# Patient Record
Sex: Female | Born: 1970 | Race: White | Hispanic: No | Marital: Married | Smoking: Never smoker
Health system: Southern US, Community
[De-identification: ages and names within clinical notes are randomized; demographics above are authoritative.]

## PROBLEM LIST (undated history)

## (undated) DIAGNOSIS — N200 Calculus of kidney: Secondary | ICD-10-CM

## (undated) DIAGNOSIS — Z800 Family history of malignant neoplasm of digestive organs: Secondary | ICD-10-CM

## (undated) DIAGNOSIS — C50919 Malignant neoplasm of unspecified site of unspecified female breast: Secondary | ICD-10-CM

## (undated) HISTORY — DX: Family history of malignant neoplasm of digestive organs: Z80.0

## (undated) HISTORY — DX: Malignant neoplasm of unspecified site of unspecified female breast: C50.919

## (undated) HISTORY — PX: OTHER SURGICAL HISTORY: SHX169

---

## 2009-04-15 ENCOUNTER — Encounter: Admission: RE | Admit: 2009-04-15 | Discharge: 2009-04-15 | Payer: Self-pay | Admitting: Family Medicine

## 2010-05-09 ENCOUNTER — Other Ambulatory Visit: Payer: Self-pay | Admitting: Family Medicine

## 2011-01-16 ENCOUNTER — Other Ambulatory Visit: Payer: Self-pay | Admitting: Family Medicine

## 2011-01-16 DIAGNOSIS — Z1231 Encounter for screening mammogram for malignant neoplasm of breast: Secondary | ICD-10-CM

## 2011-02-09 ENCOUNTER — Ambulatory Visit: Payer: Self-pay

## 2011-03-19 ENCOUNTER — Ambulatory Visit
Admission: RE | Admit: 2011-03-19 | Discharge: 2011-03-19 | Disposition: A | Discharge status: Home / Self Care | Payer: Commercial Indemnity | Source: Ambulatory Visit | Attending: Family Medicine | Admitting: Family Medicine

## 2011-03-19 DIAGNOSIS — Z1231 Encounter for screening mammogram for malignant neoplasm of breast: Secondary | ICD-10-CM

## 2012-03-31 ENCOUNTER — Other Ambulatory Visit: Payer: Self-pay | Admitting: Family Medicine

## 2012-03-31 DIAGNOSIS — Z1231 Encounter for screening mammogram for malignant neoplasm of breast: Secondary | ICD-10-CM

## 2012-04-22 ENCOUNTER — Ambulatory Visit
Admission: RE | Admit: 2012-04-22 | Discharge: 2012-04-22 | Disposition: A | Discharge status: Home / Self Care | Payer: 59 | Source: Ambulatory Visit | Attending: Family Medicine | Admitting: Family Medicine

## 2012-12-11 ENCOUNTER — Encounter (HOSPITAL_COMMUNITY): Payer: Self-pay

## 2012-12-16 ENCOUNTER — Encounter (HOSPITAL_COMMUNITY): Payer: Self-pay | Admitting: Pharmacist

## 2012-12-18 ENCOUNTER — Other Ambulatory Visit: Payer: Self-pay | Admitting: Obstetrics and Gynecology

## 2013-01-01 ENCOUNTER — Ambulatory Visit (HOSPITAL_COMMUNITY): Admission: RE | Admit: 2013-01-01 | Payer: 59 | Source: Ambulatory Visit | Admitting: Obstetrics and Gynecology

## 2013-01-01 ENCOUNTER — Encounter (HOSPITAL_COMMUNITY): Admission: RE | Payer: Self-pay | Source: Ambulatory Visit

## 2013-01-01 SURGERY — DILATATION & CURETTAGE/HYSTEROSCOPY WITH RESECTOCOPE
Anesthesia: Choice

## 2013-04-24 ENCOUNTER — Other Ambulatory Visit: Payer: Self-pay

## 2013-04-24 DIAGNOSIS — Z1231 Encounter for screening mammogram for malignant neoplasm of breast: Secondary | ICD-10-CM

## 2013-05-08 ENCOUNTER — Ambulatory Visit
Admission: RE | Admit: 2013-05-08 | Discharge: 2013-05-08 | Disposition: A | Discharge status: Home / Self Care | Payer: 59 | Source: Ambulatory Visit

## 2013-05-08 DIAGNOSIS — Z1231 Encounter for screening mammogram for malignant neoplasm of breast: Secondary | ICD-10-CM

## 2018-11-02 ENCOUNTER — Encounter (HOSPITAL_BASED_OUTPATIENT_CLINIC_OR_DEPARTMENT_OTHER): Payer: Self-pay | Admitting: Emergency Medicine

## 2018-11-02 ENCOUNTER — Emergency Department (HOSPITAL_BASED_OUTPATIENT_CLINIC_OR_DEPARTMENT_OTHER): Payer: 59

## 2018-11-02 ENCOUNTER — Other Ambulatory Visit: Payer: Self-pay

## 2018-11-02 ENCOUNTER — Emergency Department (HOSPITAL_BASED_OUTPATIENT_CLINIC_OR_DEPARTMENT_OTHER)
Admission: EM | Admit: 2018-11-02 | Discharge: 2018-11-02 | Disposition: A | Discharge status: Home / Self Care | Payer: 59 | Attending: Emergency Medicine | Admitting: Emergency Medicine

## 2018-11-02 DIAGNOSIS — N130 Hydronephrosis with ureteropelvic junction obstruction: Secondary | ICD-10-CM | POA: Diagnosis not present

## 2018-11-02 DIAGNOSIS — R1031 Right lower quadrant pain: Secondary | ICD-10-CM | POA: Diagnosis present

## 2018-11-02 DIAGNOSIS — N200 Calculus of kidney: Secondary | ICD-10-CM

## 2018-11-02 LAB — URINALYSIS, ROUTINE W REFLEX MICROSCOPIC
Bilirubin Urine: NEGATIVE
Glucose, UA: NEGATIVE mg/dL
Ketones, ur: 40 mg/dL — AB
Leukocytes,Ua: NEGATIVE
Nitrite: NEGATIVE
Protein, ur: NEGATIVE mg/dL
Specific Gravity, Urine: >1.03 — ABNORMAL HIGH (ref 1.005–1.030)
pH: 5.5 (ref 5.0–8.0)

## 2018-11-02 LAB — CBC WITH DIFFERENTIAL/PLATELET
Abs Immature Granulocytes: 0.06 10*3/uL (ref 0.00–0.07)
Basophils Absolute: 0 10*3/uL (ref 0.0–0.1)
Basophils Relative: 0 %
Eosinophils Absolute: 0 10*3/uL (ref 0.0–0.5)
Eosinophils Relative: 0 %
HCT: 42.5 % (ref 36.0–46.0)
Hemoglobin: 14.4 g/dL (ref 12.0–15.0)
Immature Granulocytes: 0 %
Lymphocytes Relative: 8 %
Lymphs Abs: 1.2 10*3/uL (ref 0.7–4.0)
MCH: 31.6 pg (ref 26.0–34.0)
MCHC: 33.9 g/dL (ref 30.0–36.0)
MCV: 93.4 fL (ref 80.0–100.0)
Monocytes Absolute: 1 10*3/uL (ref 0.1–1.0)
Monocytes Relative: 6 %
Neutro Abs: 13.6 10*3/uL — ABNORMAL HIGH (ref 1.7–7.7)
Neutrophils Relative %: 86 %
Platelets: 307 10*3/uL (ref 150–400)
RBC: 4.55 MIL/uL (ref 3.87–5.11)
RDW: 12.4 % (ref 11.5–15.5)
WBC: 15.9 10*3/uL — ABNORMAL HIGH (ref 4.0–10.5)
nRBC: 0 % (ref 0.0–0.2)

## 2018-11-02 LAB — BASIC METABOLIC PANEL
Anion gap: 17 — ABNORMAL HIGH (ref 5–15)
BUN: 20 mg/dL (ref 6–20)
CO2: 16 mmol/L — ABNORMAL LOW (ref 22–32)
Calcium: 9.1 mg/dL (ref 8.9–10.3)
Chloride: 106 mmol/L (ref 98–111)
Creatinine, Ser: 0.92 mg/dL (ref 0.44–1.00)
GFR calc Af Amer: >60 mL/min (ref >60)
GFR calc non Af Amer: >60 mL/min (ref >60)
Glucose, Bld: 173 mg/dL — ABNORMAL HIGH (ref 70–99)
Potassium: 3.3 mmol/L — ABNORMAL LOW (ref 3.5–5.1)
Sodium: 139 mmol/L (ref 135–145)

## 2018-11-02 LAB — URINALYSIS, MICROSCOPIC (REFLEX)

## 2018-11-02 MED ORDER — ONDANSETRON HCL 4 MG/2ML IJ SOLN
INTRAMUSCULAR | Status: AC
  Administered 2018-11-02: 4 mg via INTRAVENOUS
  Filled 2018-11-02: qty 2

## 2018-11-02 MED ORDER — OXYCODONE-ACETAMINOPHEN 5-325 MG PO TABS
2.0000 | ORAL_TABLET | Freq: Once | ORAL | Status: AC
  Administered 2018-11-02: 08:00:00 2 via ORAL
  Filled 2018-11-02: qty 2

## 2018-11-02 MED ORDER — KETOROLAC TROMETHAMINE 30 MG/ML IJ SOLN
INTRAMUSCULAR | Status: AC
  Administered 2018-11-02: 30 mg via INTRAVENOUS
  Filled 2018-11-02: qty 1

## 2018-11-02 MED ORDER — OXYCODONE-ACETAMINOPHEN 5-325 MG PO TABS: 1.0000 | ORAL_TABLET | Freq: Four times a day (QID) | ORAL | 0 refills | Status: DC | PRN

## 2018-11-02 MED ORDER — KETOROLAC TROMETHAMINE 30 MG/ML IJ SOLN
30.0000 mg | Freq: Once | INTRAMUSCULAR | Status: AC
  Administered 2018-11-02: 06:00:00 30 mg via INTRAVENOUS

## 2018-11-02 MED ORDER — ONDANSETRON HCL 4 MG/2ML IJ SOLN
INTRAMUSCULAR | Status: AC
  Filled 2018-11-02: qty 2

## 2018-11-02 MED ORDER — MORPHINE SULFATE (PF) 4 MG/ML IV SOLN
4.0000 mg | Freq: Once | INTRAVENOUS | Status: AC
  Administered 2018-11-02: 06:00:00 4 mg via INTRAVENOUS

## 2018-11-02 MED ORDER — ONDANSETRON HCL 4 MG/2ML IJ SOLN
4.0000 mg | Freq: Once | INTRAMUSCULAR | Status: AC
  Administered 2018-11-02: 06:00:00 4 mg via INTRAVENOUS

## 2018-11-02 MED ORDER — MORPHINE SULFATE (PF) 4 MG/ML IV SOLN
INTRAVENOUS | Status: AC
  Administered 2018-11-02: 4 mg via INTRAVENOUS
  Filled 2018-11-02: qty 1

## 2018-11-02 MED ORDER — ONDANSETRON HCL 4 MG/2ML IJ SOLN
4.0000 mg | Freq: Once | INTRAMUSCULAR | Status: AC
  Administered 2018-11-02: 07:00:00 4 mg via INTRAVENOUS

## 2018-11-02 NOTE — ED Triage Notes (Signed)
Patient states that she started to have right sided flank pain at 11 pm

## 2018-11-02 NOTE — ED Provider Notes (Signed)
[facility]ER[facility] DEPARTMENT Provider Note   CSN: [id number] Arrival date & time: 11/02/18  0455     History   Chief Complaint Chief Complaint  Patient presents with  . Flank Pain    HPI Jasmine Shelton is a 48 y.o. female.     Patient is a 48 year old female with no significant past medical history.  She presents today for evaluation of right flank pain.  This woke her from sleep at approximately 1130 this evening and is rapidly worsening.  She denies any bowel or bladder complaints.  She denies any fevers or chills.  She denies prior history of kidney stones.  The history is provided by the patient.  Flank Pain This is a new problem. Episode onset: 11:30 PM. The problem occurs constantly. The problem has been rapidly worsening. Pertinent negatives include no abdominal pain. Nothing aggravates the symptoms. Nothing relieves the symptoms. She has tried nothing for the symptoms.    History reviewed. No pertinent past medical history.  There are no active problems to display for this patient.   Past Surgical History:  Procedure Laterality Date  . cold knife conization       OB History   No obstetric history on file.      Home Medications    Prior to Admission medications   Medication Sig Start Date End Date Taking? Authorizing Provider  ibuprofen (ADVIL,MOTRIN) 200 MG tablet Take 600 mg by mouth every 6 (six) hours as needed for headache.    [provider]    Family History History reviewed. No pertinent family history.  Social History Social History   Tobacco Use  . Smoking status: Never Smoker  . Smokeless tobacco: Never Used  Substance Use Topics  . Alcohol use: No  . Drug use: No     Allergies   Patient has no known allergies.   Review of Systems Review of Systems  Gastrointestinal: Negative for abdominal pain.  Genitourinary: Positive for flank pain.  All other systems reviewed and are negative.    Physical Exam  Updated Vital Signs Temp 98.5 F (36.9 C) (Oral)   Resp 20   Ht 5\' 3"  (1.6 m)   Wt 57.2 kg   LMP 11/24/2012   SpO2 100%   BMI 22.32 kg/m   Physical Exam Vitals signs and nursing note reviewed.  Constitutional:      General: She is not in acute distress.    Appearance: She is well-developed. She is not diaphoretic.  HENT:     Head: Normocephalic and atraumatic.  Neck:     Musculoskeletal: Normal range of motion and neck supple.  Cardiovascular:     Rate and Rhythm: Normal rate and regular rhythm.     Heart sounds: No murmur. No friction rub. No gallop.   Pulmonary:     Effort: Pulmonary effort is normal. No respiratory distress.     Breath sounds: Normal breath sounds. No wheezing.  Abdominal:     General: Bowel sounds are normal. There is no distension.     Palpations: Abdomen is soft.     Tenderness: There is no abdominal tenderness. There is right CVA tenderness. There is no left CVA tenderness, guarding or rebound.  Musculoskeletal: Normal range of motion.  Skin:    General: Skin is warm and dry.  Neurological:     Mental Status: She is alert and oriented to person, place, and time.      ED Treatments / Results  Labs (all labs ordered  are listed, but only abnormal results are displayed) Labs Reviewed  BASIC METABOLIC PANEL  CBC WITH DIFFERENTIAL/PLATELET  URINALYSIS, ROUTINE W REFLEX MICROSCOPIC    EKG None  Radiology No results found.  Procedures Procedures (including critical care time)  Medications Ordered in ED Medications  morphine 4 MG/ML injection 4 mg (4 mg Intravenous Given 11/02/18 0533)  ondansetron (ZOFRAN) injection 4 mg (4 mg Intravenous Given 11/02/18 0533)  ketorolac (TORADOL) 30 MG/ML injection 30 mg (30 mg Intravenous Given 11/02/18 0533)     Initial Impression / Assessment and Plan / ED Course  I have reviewed the triage vital signs and the nursing notes.  Pertinent labs & imaging results that were available during my care of the  patient were reviewed by me and considered in my medical decision making (see chart for details).  Patient presenting here with complaints of right flank pain.  She has what appears to be a 3 mm calculus in the right UVJ or urinary bladder with hydronephrosis.  She is feeling better after pain medications.  Will treat with pain meds, return prn.    Final Clinical Impressions(s) / ED Diagnoses   Final diagnoses:  None    ED Discharge Orders    None       Veryl Speak, MD 11/02/18 [phone]

## 2018-11-02 NOTE — Discharge Instructions (Signed)
Percocet as prescribed as needed for pain.  Follow-up with urology if your symptoms or not improving in the next 3 to 4 days, and return to the ER if you develop worsening pain, high fever, or other new and concerning symptoms.  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  The radiologist has recommended outpatient urology follow-up.  The contact information for [facility] has been provided in this discharge summary for you to call and make these arrangements.

## 2019-01-10 ENCOUNTER — Emergency Department (HOSPITAL_BASED_OUTPATIENT_CLINIC_OR_DEPARTMENT_OTHER)
Admission: EM | Admit: 2019-01-10 | Discharge: 2019-01-10 | Disposition: A | Discharge status: Home / Self Care | Payer: 59 | Attending: Emergency Medicine | Admitting: Emergency Medicine

## 2019-01-10 ENCOUNTER — Encounter (HOSPITAL_BASED_OUTPATIENT_CLINIC_OR_DEPARTMENT_OTHER): Payer: Self-pay | Admitting: Emergency Medicine

## 2019-01-10 ENCOUNTER — Other Ambulatory Visit: Payer: Self-pay

## 2019-01-10 ENCOUNTER — Emergency Department (HOSPITAL_BASED_OUTPATIENT_CLINIC_OR_DEPARTMENT_OTHER): Payer: 59

## 2019-01-10 DIAGNOSIS — R109 Unspecified abdominal pain: Secondary | ICD-10-CM | POA: Diagnosis present

## 2019-01-10 DIAGNOSIS — N201 Calculus of ureter: Secondary | ICD-10-CM | POA: Diagnosis not present

## 2019-01-10 HISTORY — DX: Calculus of kidney: N20.0

## 2019-01-10 LAB — BASIC METABOLIC PANEL
Anion gap: 12 (ref 5–15)
BUN: 18 mg/dL (ref 6–20)
CO2: 19 mmol/L — ABNORMAL LOW (ref 22–32)
Calcium: 9.6 mg/dL (ref 8.9–10.3)
Chloride: 108 mmol/L (ref 98–111)
Creatinine, Ser: 1 mg/dL (ref 0.44–1.00)
GFR calc Af Amer: >60 mL/min (ref >60)
GFR calc non Af Amer: >60 mL/min (ref >60)
Glucose, Bld: 166 mg/dL — ABNORMAL HIGH (ref 70–99)
Potassium: 3.3 mmol/L — ABNORMAL LOW (ref 3.5–5.1)
Sodium: 139 mmol/L (ref 135–145)

## 2019-01-10 LAB — URINALYSIS, MICROSCOPIC (REFLEX)

## 2019-01-10 LAB — URINALYSIS, ROUTINE W REFLEX MICROSCOPIC
Bilirubin Urine: NEGATIVE
Glucose, UA: NEGATIVE mg/dL
Ketones, ur: >80 mg/dL — AB
Leukocytes,Ua: NEGATIVE
Nitrite: NEGATIVE
Protein, ur: NEGATIVE mg/dL
Specific Gravity, Urine: >1.03 — ABNORMAL HIGH (ref 1.005–1.030)
pH: 6 (ref 5.0–8.0)

## 2019-01-10 LAB — CBC
HCT: 42.4 % (ref 36.0–46.0)
Hemoglobin: 14.6 g/dL (ref 12.0–15.0)
MCH: 31.4 pg (ref 26.0–34.0)
MCHC: 34.4 g/dL (ref 30.0–36.0)
MCV: 91.2 fL (ref 80.0–100.0)
Platelets: 335 10*3/uL (ref 150–400)
RBC: 4.65 MIL/uL (ref 3.87–5.11)
RDW: 12.6 % (ref 11.5–15.5)
WBC: 11.1 10*3/uL — ABNORMAL HIGH (ref 4.0–10.5)
nRBC: 0 % (ref 0.0–0.2)

## 2019-01-10 MED ORDER — NAPROXEN 500 MG PO TABS: 500.0000 mg | ORAL_TABLET | Freq: Two times a day (BID) | ORAL | 0 refills | Status: DC

## 2019-01-10 MED ORDER — MORPHINE SULFATE (PF) 2 MG/ML IV SOLN
2.0000 mg | Freq: Once | INTRAVENOUS | Status: AC
  Administered 2019-01-10: 2 mg via INTRAVENOUS
  Filled 2019-01-10: qty 1

## 2019-01-10 MED ORDER — ONDANSETRON HCL 4 MG/2ML IJ SOLN
4.0000 mg | Freq: Once | INTRAMUSCULAR | Status: AC
  Administered 2019-01-10: 4 mg via INTRAVENOUS
  Filled 2019-01-10: qty 2

## 2019-01-10 MED ORDER — SODIUM CHLORIDE 0.9 % IV BOLUS
500.0000 mL | Freq: Once | INTRAVENOUS | Status: AC
  Administered 2019-01-10: 08:00:00 500 mL via INTRAVENOUS

## 2019-01-10 MED ORDER — MORPHINE SULFATE (PF) 2 MG/ML IV SOLN
2.0000 mg | Freq: Once | INTRAVENOUS | Status: AC
  Administered 2019-01-10: 08:00:00 2 mg via INTRAVENOUS
  Filled 2019-01-10: qty 1

## 2019-01-10 MED ORDER — KETOROLAC TROMETHAMINE 30 MG/ML IJ SOLN
30.0000 mg | Freq: Once | INTRAMUSCULAR | Status: AC
  Administered 2019-01-10: 30 mg via INTRAVENOUS
  Filled 2019-01-10: qty 1

## 2019-01-10 MED ORDER — SODIUM CHLORIDE 0.9 % IV SOLN
INTRAVENOUS | Status: DC
  Administered 2019-01-10: 08:00:00 via INTRAVENOUS

## 2019-01-10 MED ORDER — HYDROCODONE-ACETAMINOPHEN 5-325 MG PO TABS: 1.0000 | ORAL_TABLET | Freq: Four times a day (QID) | ORAL | 0 refills | Status: DC | PRN

## 2019-01-10 MED ORDER — ONDANSETRON 4 MG PO TBDP: 4.0000 mg | ORAL_TABLET | Freq: Three times a day (TID) | ORAL | 1 refills | Status: DC | PRN

## 2019-01-10 NOTE — ED Notes (Signed)
Family at bedside. 

## 2019-01-10 NOTE — ED Triage Notes (Signed)
Pt c/o left sided flank pain since 5am. States it feels like the last time she had kidney stones. N/V present.

## 2019-01-10 NOTE — Discharge Instructions (Addendum)
Take the Naprosyn as directed on a regular basis.  Use the Zofran for nausea and vomiting.  For more severe pain take the hydrocodone 1 tablet every 6 hours.  Make an appointment to follow-up with urology.  Return for any new or worse symptoms to include fever persistent vomiting.  Also your urine was sent for urine culture.  If it grows any significant bacteria suggestive of infection you will be contacted.

## 2019-01-10 NOTE — ED Provider Notes (Addendum)
[facility] DEPARTMENT Provider Note   CSN: [id number] Arrival date & time: 01/10/19  [id number]     History   Chief Complaint Chief Complaint  Patient presents with  . Flank Pain  . Nausea  . Emesis    HPI Jasmine Shelton is a 48 y.o. female.     Patient with known history of kidney stones.  Acute onset of left flank pain at 5 AM this morning.  Associated with nausea and vomiting.  Pain reminds her of her last kidney stone attack which was in September.     Past Medical History:  Diagnosis Date  . Kidney stones     There are no active problems to display for this patient.   Past Surgical History:  Procedure Laterality Date  . cold knife conization       OB History   No obstetric history on file.      Home Medications    Prior to Admission medications   Medication Sig Start Date End Date Taking? Authorizing Provider  HYDROcodone-acetaminophen (NORCO/VICODIN) 5-325 MG tablet Take 1 tablet by mouth every 6 (six) hours as needed for moderate pain. 01/10/19   Fredia Sorrow, MD  ibuprofen (ADVIL,MOTRIN) 200 MG tablet Take 600 mg by mouth every 6 (six) hours as needed for headache.    [provider]  naproxen (NAPROSYN) 500 MG tablet Take 1 tablet (500 mg total) by mouth 2 (two) times daily. 01/10/19   Fredia Sorrow, MD  ondansetron (ZOFRAN ODT) 4 MG disintegrating tablet Take 1 tablet (4 mg total) by mouth every 8 (eight) hours as needed. 01/10/19   Fredia Sorrow, MD  oxyCODONE-acetaminophen (PERCOCET) 5-325 MG tablet Take 1-2 tablets by mouth every 6 (six) hours as needed. 11/02/18   Veryl Speak, MD    Family History History reviewed. No pertinent family history.  Social History Social History   Tobacco Use  . Smoking status: Never Smoker  . Smokeless tobacco: Never Used  Substance Use Topics  . Alcohol use: No  . Drug use: No     Allergies   Patient has no known allergies.   Review of Systems Review of  Systems  Constitutional: Negative for chills and fever.  HENT: Negative for congestion, rhinorrhea and sore throat.   Eyes: Negative for visual disturbance.  Respiratory: Negative for cough and shortness of breath.   Cardiovascular: Negative for chest pain and leg swelling.  Gastrointestinal: Positive for nausea and vomiting. Negative for abdominal pain and diarrhea.  Genitourinary: Positive for flank pain. Negative for dysuria.  Musculoskeletal: Negative for back pain and neck pain.  Skin: Negative for rash.  Neurological: Negative for dizziness, light-headedness and headaches.  Hematological: Does not bruise/bleed easily.  Psychiatric/Behavioral: Negative for confusion.     Physical Exam Updated Vital Signs BP (!) 162/93 (BP Location: Right Arm)   Pulse 74   Temp 98.3 F (36.8 C) (Oral)   Resp (!) 24   Ht 1.6 m (5\' 3" )   Wt 58.1 kg   LMP 11/24/2012   SpO2 100%   BMI 22.67 kg/m   Physical Exam Vitals signs and nursing note reviewed.  Constitutional:      General: She is not in acute distress.    Appearance: Normal appearance. She is well-developed.  HENT:     Head: Normocephalic and atraumatic.  Eyes:     Extraocular Movements: Extraocular movements intact.     Conjunctiva/sclera: Conjunctivae normal.     Pupils: Pupils are equal, round, and reactive to  light.  Neck:     Musculoskeletal: Normal range of motion and neck supple.  Cardiovascular:     Rate and Rhythm: Normal rate and regular rhythm.     Heart sounds: No murmur.  Pulmonary:     Effort: Pulmonary effort is normal. No respiratory distress.     Breath sounds: Normal breath sounds.  Abdominal:     Palpations: Abdomen is soft.     Tenderness: There is no abdominal tenderness.  Musculoskeletal: Normal range of motion.  Skin:    General: Skin is warm and dry.     Capillary Refill: Capillary refill takes less than 2 seconds.  Neurological:     General: No focal deficit present.     Mental Status: She is  alert and oriented to person, place, and time.      ED Treatments / Results  Labs (all labs ordered are listed, but only abnormal results are displayed) Labs Reviewed  URINALYSIS, ROUTINE W REFLEX MICROSCOPIC - Abnormal; Notable for the following components:      Result Value   APPearance CLOUDY (*)    Specific Gravity, Urine >1.030 (*)    Hgb urine dipstick LARGE (*)    Ketones, ur >80 (*)    All other components within normal limits  BASIC METABOLIC PANEL - Abnormal; Notable for the following components:   Potassium 3.3 (*)    CO2 19 (*)    Glucose, Bld 166 (*)    All other components within normal limits  CBC - Abnormal; Notable for the following components:   WBC 11.1 (*)    All other components within normal limits  URINALYSIS, MICROSCOPIC (REFLEX) - Abnormal; Notable for the following components:   Bacteria, UA MANY (*)    All other components within normal limits  URINE CULTURE    EKG None  Radiology Ct Renal Stone Study  Result Date: 01/10/2019 CLINICAL DATA:  Left-sided flank pain since 5 a.m. History kidney stones. EXAM: CT ABDOMEN AND PELVIS WITHOUT CONTRAST TECHNIQUE: Multidetector CT imaging of the abdomen and pelvis was performed following the standard protocol without IV contrast. COMPARISON:  11/02/2018 FINDINGS: Lower chest: Clear lung bases. Normal heart size without pericardial or pleural effusion. Hepatobiliary: Normal liver. Normal gallbladder, without biliary ductal dilatation. Pancreas: Normal, without mass or ductal dilatation. Spleen: Normal in size, without focal abnormality. Adrenals/Urinary Tract: Normal adrenal glands. Punctate left renal collecting system calculi. Normal noncontrast appearance of the right kidney. Left perirenal edema is moderate. Mild left-sided hydroureteronephrosis to the level of a 2 mm stone just above the ureterovesicular junction on 76/2. No bladder calculi. Redemonstration of urethral diverticulum or diverticula with  dependent stone of 4 mm on 83/2. Stomach/Bowel: Tiny hiatal hernia. Normal colon, appendix, and terminal ileum. Normal small bowel. Vascular/Lymphatic: Normal caliber of the aorta and branch vessels. No abdominopelvic adenopathy. Reproductive: Normal uterus and adnexa. Other: No significant free fluid. Musculoskeletal: No acute osseous abnormality. IMPRESSION: 1. Left-sided urinary tract obstruction secondary to a distal left ureteric 2 mm stone. 2. 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 3. Left nephrolithiasis. 4.  Tiny hiatal hernia. Electronically Signed   By: Abigail Miyamoto M.D.   On: 01/10/2019 09:27    Procedures Procedures (including critical care time)  Medications Ordered in ED Medications  0.9 %  sodium chloride infusion ( Intravenous Rate/Dose Change 01/10/19 0955)  sodium chloride 0.9 % bolus 500 mL (0 mLs Intravenous Stopped 01/10/19 0954)  morphine 2 MG/ML injection 2 mg (2 mg Intravenous  Given 01/10/19 0812)  ondansetron [facility]) injection 4 mg (4 mg Intravenous Given 01/10/19 [id number])  morphine 2 MG/ML injection 2 mg (2 mg Intravenous Given 01/10/19 0841)  ketorolac (TORADOL) 30 MG/ML injection 30 mg (30 mg Intravenous Given 01/10/19 0955)     Initial Impression / Assessment and Plan / ED Course  I have reviewed the triage vital signs and the nursing notes.  Pertinent labs & imaging results that were available during my care of the patient were reviewed by me and considered in my medical decision making (see chart for details).       Patient symptoms are highly suggestive of a recurrent renal stone disease.  Also has hematuria.  There is some bacteria in the urine.  Symptoms were of acute onset.  Will send for culture.  CT scan results pending.  CT scan confirms a distal 2 mm left ureteral stone.  Patient still struggling with pain after 2 doses of morphine.  We will try IV Toradol.  Then reassess.  Patient's urine does have some bacteria along with  hematuria.  The patient had no urinary tract symptoms prior to the sudden onset of pain.  We will send urine for culture.  Will not start on antibiotics at this time.  As stated above.   Patient with significant pain relief with the Toradol.  Final Clinical Impressions(s) / ED Diagnoses   Final diagnoses:  Ureteral calculus, left    ED Discharge Orders         Ordered    HYDROcodone-acetaminophen (NORCO/VICODIN) 5-325 MG tablet  Every 6 hours PRN     01/10/19 1045    naproxen (NAPROSYN) 500 MG tablet  2 times daily     01/10/19 1045    ondansetron (ZOFRAN ODT) 4 MG disintegrating tablet  Every 8 hours PRN     01/10/19 1045           Fredia Sorrow, MD 01/10/19 1046    Fredia Sorrow, MD 01/10/19 1046

## 2019-01-10 NOTE — ED Notes (Signed)
Patient transported to CT 

## 2019-01-11 LAB — URINE CULTURE: Culture: NO GROWTH

## 2019-05-18 ENCOUNTER — Ambulatory Visit: Payer: 59 | Attending: Internal Medicine

## 2019-05-18 DIAGNOSIS — Z23 Encounter for immunization: Secondary | ICD-10-CM

## 2019-05-18 NOTE — Progress Notes (Signed)
   Covid-19 Vaccination Clinic  Name:  Jasmine Shelton    MRN: [id number] DOB: March 14, 1970  05/18/2019  Jasmine Shelton was observed post Covid-19 immunization for 15 minutes without incident. She was provided with Vaccine Information Sheet and instruction to access the V-Safe system.   Jasmine Shelton was instructed to call 911 with any severe reactions post vaccine: Marland Kitchen Difficulty breathing  . Swelling of face and throat  . A fast heartbeat  . A bad rash all over body  . Dizziness and weakness   Immunizations Administered    Name Date Dose VIS Date Route   Pfizer COVID-19 Vaccine 05/18/2019  4:06 PM 0.3 mL 01/30/2019 Intramuscular   Manufacturer: [place]   Lot: [id number]   [place]: [id number]

## 2019-06-10 ENCOUNTER — Ambulatory Visit: Payer: 59 | Attending: Internal Medicine

## 2019-06-10 DIAGNOSIS — Z23 Encounter for immunization: Secondary | ICD-10-CM

## 2019-06-10 NOTE — Progress Notes (Signed)
   Covid-19 Vaccination Clinic  Name:  Jasmine Shelton    MRN: [id number] DOB: Apr 17, 1970  06/10/2019  [name] was observed post Covid-19 immunization for 15 minutes without incident. She was provided with Vaccine Information Sheet and instruction to access the V-Safe system.   [name] was instructed to call 911 with any severe reactions post vaccine: Marland Kitchen Difficulty breathing  . Swelling of face and throat  . A fast heartbeat  . A bad rash all over body  . Dizziness and weakness   Immunizations Administered    Name Date Dose VIS Date Route   Pfizer COVID-19 Vaccine 06/10/2019 10:28 AM 0.3 mL 04/15/2018 Intramuscular   Manufacturer: [place]   Lot: [id number]   [place]: [id number]

## 2019-06-17 ENCOUNTER — Other Ambulatory Visit: Payer: Self-pay | Admitting: Obstetrics and Gynecology

## 2019-06-17 DIAGNOSIS — N6320 Unspecified lump in the left breast, unspecified quadrant: Secondary | ICD-10-CM

## 2019-07-07 ENCOUNTER — Other Ambulatory Visit: Payer: Self-pay | Admitting: Obstetrics and Gynecology

## 2019-07-07 ENCOUNTER — Ambulatory Visit
Admission: RE | Admit: 2019-07-07 | Discharge: 2019-07-07 | Disposition: A | Discharge status: Home / Self Care | Payer: 59 | Source: Ambulatory Visit | Attending: Obstetrics and Gynecology | Admitting: Obstetrics and Gynecology

## 2019-07-07 ENCOUNTER — Other Ambulatory Visit: Payer: Self-pay

## 2019-07-07 DIAGNOSIS — N6320 Unspecified lump in the left breast, unspecified quadrant: Secondary | ICD-10-CM

## 2019-07-09 ENCOUNTER — Other Ambulatory Visit: Payer: 59

## 2019-07-09 ENCOUNTER — Telehealth: Payer: Self-pay | Admitting: Hematology

## 2019-07-09 NOTE — Telephone Encounter (Signed)
Spoke with patient to confirm afternoon [facility] appointment for 5/26, packet sent via email

## 2019-07-13 ENCOUNTER — Encounter: Payer: Self-pay | Admitting: *Deleted

## 2019-07-13 DIAGNOSIS — Z170 Estrogen receptor positive status [ER+]: Secondary | ICD-10-CM | POA: Insufficient documentation

## 2019-07-14 NOTE — Progress Notes (Signed)
[place]   Telephone:([phone]) [phone] Fax:([phone]) [place] Note   Patient Care Team: System, Pcp Not In as PCP - General Mauro Kaufmann, RN as Oncology Nurse Navigator Rockwell Germany, RN as Oncology Nurse Navigator Coralie Keens, MD as Consulting Physician (General Surgery) Truitt Merle, MD as Consulting Physician (Hematology) Kyung Rudd, MD as Consulting Physician (Radiation Oncology)  Date of Service:  07/15/2019   CHIEF COMPLAINTS/PURPOSE OF CONSULTATION:  Newly Diagnosed Malignant neoplasm of upper-outer quadrant of left breast    Oncology History Overview Note  Cancer Staging Malignant neoplasm of upper-outer quadrant of left breast in female, estrogen receptor positive ([place]) Staging form: Breast, AJCC 8th Edition - Clinical stage from 07/07/2019: Stage IB (cT2, cN0, cM0, G2, ER+, PR+, HER2-) - Signed by Truitt Merle, MD on 07/14/2019    Malignant neoplasm of upper-outer quadrant of left breast in female, estrogen receptor positive ([place])  07/07/2019 Mammogram   IMPRESSION: 1. There is a 2.5 x 1.9 x 0.8 cm suspicious mass in the left breast at 2'clock, 5cm fromnipple, likely corresponding with the area of distortion seen mammographically.   2.  No evidence of left axillary lymphadenopathy.   07/07/2019 Initial Biopsy   Diagnosis Breast, left, needle core biopsy, 2 o'clock - INVASIVE MAMMARY CARCINOMA. - SEE COMMENT. Microscopic Comment The carcinoma appears grade 1-2. The longest span of tumor is 1.1 cm. An E-cadherin and a breast prognostic profile will be performed and the results reported separately. Dr. Vicente Males has reviewed the case and concurs with this interpretation. The results are called to [facility] on 07/08/2019. (JBK:gt, 07/08/19)   07/07/2019 Receptors her2   PROGNOSTIC INDICATORS Results: IMMUNOHISTOCHEMICAL AND MORPHOMETRIC ANALYSIS PERFORMED MANUALLY The tumor cells are NEGATIVE for Her2  (1+). Estrogen Receptor: 90%, POSITIVE, STRONG STAINING INTENSITY Progesterone Receptor: 95%, POSITIVE, STRONG STAINING INTENSITY Proliferation Marker Ki67: 5%   07/07/2019 Cancer Staging   Staging form: Breast, AJCC 8th Edition - Clinical stage from 07/07/2019: Stage IB (cT2, cN0, cM0, G2, ER+, PR+, HER2-) - Signed by Truitt Merle, MD on 07/14/2019   07/13/2019 Initial Diagnosis   Malignant neoplasm of upper-outer quadrant of left breast in female, estrogen receptor positive ([place])      HISTORY OF PRESENTING ILLNESS:  Jasmine Shelton 49 y.o. female is a here because of newly diagnosed left breast cancer. The patient presents to the clinic today accompanied by her husband.   She felt her left breast mass 1 month ago. She denies pain or skin or nipple change at that time. In the past month the skin is dimpling and mass stable. She notes she does mammograms yearly , last in 11/2019 at [facility] which was clear. She notes she has mammogram moistly yearly. She denies chest or breathing issues, abdominal issues and has adequate appetite and stable weight. Today she notes her hot flashes increased after stopping Hormonal replacement.   Socially she is married with 1 daughter and 1 son (40 and 78 years old respectively). She is an Optometrist for a living. She lives in [place]. She does not smoke, drink or do recreational drugs. She has a PMHx of h/o of colon polyps removed and kidney stones. She denies family history of breast cancer.    GYN HISTORY  Menarchal: 12 LMP: May 2019 Contraceptive: 9 years 1990-1999 HRT: Bijuva (estrogen and progesterone) 19 months - stopped 07/08/19.  G2P2 - first at age 32    REVIEW OF SYSTEMS:  Constitutional: Denies fevers, chills or abnormal night sweats (+) hot flashes  Eyes: Denies blurriness of vision, double vision or watery eyes Ears, nose, mouth, throat, and face: Denies mucositis or sore throat Respiratory: Denies cough, dyspnea or  wheezes Cardiovascular: Denies palpitation, chest discomfort or lower extremity swelling Gastrointestinal:  Denies nausea, heartburn or change in bowel habits Skin: Denies abnormal skin rashes Lymphatics: Denies new lymphadenopathy or easy bruising Neurological:Denies numbness, tingling or new weaknesses Behavioral/Psych: Mood is stable, no new changes  Breast: (+) Left breast mass and skin dimpling  All other systems were reviewed with the patient and are negative.   MEDICAL HISTORY:  Past Medical History:  Diagnosis Date  . Breast cancer ([place])   . Family history of colon cancer   . Kidney stones     SURGICAL HISTORY: Past Surgical History:  Procedure Laterality Date  . cold knife conization      SOCIAL HISTORY: Social History   Socioeconomic History  . Marital status: Married    Spouse name: Not on file  . Number of children: 2  . Years of education: Not on file  . Highest education level: Not on file  Occupational History  . Not on file  Tobacco Use  . Smoking status: Never Smoker  . Smokeless tobacco: Never Used  Substance and Sexual Activity  . Alcohol use: No  . Drug use: No  . Sexual activity: Not Currently  Other Topics Concern  . Not on file  Social History Narrative  . Not on file   Social Determinants of Health   Financial Resource Strain:   . Difficulty of Paying Living Expenses:   Food Insecurity:   . Worried About Charity fundraiser in the Last Year:   . Arboriculturist in the Last Year:   Transportation Needs:   . Film/video editor (Medical):   Marland Kitchen Lack of Transportation (Non-Medical):   Physical Activity:   . Days of Exercise per Week:   . Minutes of Exercise per Session:   Stress:   . Feeling of Stress :   Social Connections:   . Frequency of Communication with Friends and Family:   . Frequency of Social Gatherings with Friends and Family:   . Attends Religious Services:   . Active Member of Clubs or Organizations:   . Attends  Archivist Meetings:   Marland Kitchen Marital Status:   Intimate Partner Violence:   . Fear of Current or Ex-Partner:   . Emotionally Abused:   Marland Kitchen Physically Abused:   . Sexually Abused:     FAMILY HISTORY: Family History  Problem Relation Age of Onset  . Other Father        abdominal GIST dx 46  . Colon cancer Maternal Grandfather 57  . Cancer Maternal Uncle        blood cancer    ALLERGIES:  has No Known Allergies.  MEDICATIONS:  No current outpatient medications on file.   No current facility-administered medications for this visit.    PHYSICAL EXAMINATION: ECOG PERFORMANCE STATUS: 0 - Asymptomatic  Vitals:   07/15/19 1259  BP: (!) 147/84  Pulse: 69  Resp: 17  Temp: 99.1 F (37.3 C)  SpO2: 100%   Filed Weights   07/15/19 1259  Weight: 59.3 kg    GENERAL:alert, no distress and comfortable SKIN: skin color, texture, turgor are normal, no rashes or significant lesions EYES: normal, Conjunctiva are pink and non-injected, sclera clear  NECK: supple, thyroid normal size, non-tender,  without nodularity LYMPH:  no palpable lymphadenopathy in the cervical, axillary  LUNGS: clear to auscultation and percussion with normal breathing effort HEART: regular rate & rhythm and no murmurs and no lower extremity edema ABDOMEN:abdomen soft, non-tender and normal bowel sounds Musculoskeletal:no cyanosis of digits and no clubbing  NEURO: alert & oriented x 3 with fluent speech, no focal motor/sensory deficits BREAST: (+) Mild skin ecchymosis of left breast at biopsy site. (+) 4x4.5cm at 2-3:00 position (+)  Benign right breast exam.   LABORATORY DATA:  I have reviewed the data as listed CBC Latest Ref Rng & Units 07/15/2019 01/10/2019 11/02/2018  WBC 4.0 - 10.5 K/uL 5.9 11.1(H) 15.9(H)  Hemoglobin 12.0 - 15.0 g/dL 14.9 14.6 14.4  Hematocrit 36.0 - 46.0 % 43.9 42.4 42.5  Platelets 150 - 400 K/uL 329 335 307    CMP Latest Ref Rng & Units 07/15/2019 01/10/2019 11/02/2018  Glucose  70 - 99 mg/dL 116(H) 166(H) 173(H)  BUN 6 - 20 mg/dL _0 Creatinine 0.44 - 1.00 mg/dL 0.82 1.00 0.92  Sodium 135 - 145 mmol/L 143 139 139  Potassium 3.5 - 5.1 mmol/L 3.9 3.3(L) 3.3(L)  Chloride 98 - 111 mmol/L 108 108 106  CO2 22 - 32 mmol/L 27 19(L) 16(L)  Calcium 8.9 - 10.3 mg/dL 9.5 9.6 9.1  Total Protein 6.5 - 8.1 g/dL 7.4 - -  Total Bilirubin 0.3 - 1.2 mg/dL 0.4 - -  Alkaline Phos 38 - 126 U/L 75 - -  AST 15 - 41 U/L 14(L) - -  ALT 0 - 44 U/L 14 - -     RADIOGRAPHIC STUDIES: I have personally reviewed the radiological images as listed and agreed with the findings in the report. US BREAST LTD UNI LEFT INC AXILLA  Result Date: 07/07/2019 CLINICAL DATA:  49 year old female presenting for evaluation of a palpable lump in the upper outer left breast about 3 weeks. EXAM: DIGITAL DIAGNOSTIC UNILATERAL LEFT MAMMOGRAM WITH CAD AND TOMO LEFT BREAST ULTRASOUND COMPARISON:  Previous exam(s). ACR Breast Density Category c: The breast tissue is heterogeneously dense, which may obscure small masses. FINDINGS: A BB has been placed at the palpable site of concern in the upper-outer quadrant of the left breast. Deep to the palpable marker there is a broad area of distortion. Mammographic images were processed with CAD. Physical exam of the lateral left breast demonstrates a firm palpable lump spanning approximately 5 cm. Ultrasound targeted to the left breast at 2 o'clock 5 cm nipple demonstrates an ill-defined area of shadowing measuring 2.5 x 1.9 x 0.8 cm. Ultrasound of the left axilla demonstrates normal-appearing lymph nodes. IMPRESSION: 1. There is a 2.5 cm suspicious mass in the left breast at 2 o'clock, likely corresponding with the area of distortion seen mammographically. 2.  No evidence of left axillary lymphadenopathy. RECOMMENDATION: Ultrasound guided biopsy is recommended for the left breast mass. This has been scheduled for 07/07/2019 at 11:30 a.m. I have discussed the findings and  recommendations with the patient. If applicable, a reminder letter will be sent to the patient regarding the next appointment. BI-RADS CATEGORY  5: Highly suggestive of malignancy. Electronically Signed   By: Ammie Ferrier M.D.   On: 07/07/2019 09:04   MM DIAG BREAST TOMO UNI LEFT  Result Date: 07/07/2019 CLINICAL DATA:  49 year old female presenting for evaluation of a palpable lump in the upper outer left breast about 3 weeks. EXAM: DIGITAL DIAGNOSTIC UNILATERAL LEFT MAMMOGRAM WITH CAD AND TOMO LEFT BREAST ULTRASOUND COMPARISON:  Previous exam(s). ACR Breast Density Category c: The breast tissue is heterogeneously dense, which may obscure small masses. FINDINGS: A BB has been placed at the palpable site of concern in the upper-outer quadrant of the left breast. Deep to the palpable marker there is a broad area of distortion. Mammographic images were processed with CAD. Physical exam of the lateral left breast demonstrates a firm palpable lump spanning approximately 5 cm. Ultrasound targeted to the left breast at 2 o'clock 5 cm nipple demonstrates an ill-defined area of shadowing measuring 2.5 x 1.9 x 0.8 cm. Ultrasound of the left axilla demonstrates normal-appearing lymph nodes. IMPRESSION: 1. There is a 2.5 cm suspicious mass in the left breast at 2 o'clock, likely corresponding with the area of distortion seen mammographically. 2.  No evidence of left axillary lymphadenopathy. RECOMMENDATION: Ultrasound guided biopsy is recommended for the left breast mass. This has been scheduled for 07/07/2019 at 11:30 a.m. I have discussed the findings and recommendations with the patient. If applicable, a reminder letter will be sent to the patient regarding the next appointment. BI-RADS CATEGORY  5: Highly suggestive of malignancy. Electronically Signed   By: Ammie Ferrier M.D.   On: 07/07/2019 09:04   MM CLIP PLACEMENT LEFT  Result Date: 07/07/2019 CLINICAL DATA:  Patient status post ultrasound-guided  biopsy left breast mass 2 o'clock position. EXAM: DIAGNOSTIC LEFT MAMMOGRAM POST ULTRASOUND BIOPSY COMPARISON:  Previous exam(s). FINDINGS: Mammographic images were obtained following ultrasound guided biopsy of left breast mass 2 o'clock position. The biopsy marking clip is in expected position at the site of biopsy. IMPRESSION: Appropriate positioning of the ribbon shaped biopsy marking clip at the site of biopsy in the left breast mass 2 o'clock position. Final Assessment: Post Procedure Mammograms for Marker Placement Electronically Signed   By: Lovey Newcomer M.D.   On: 07/07/2019 12:32   [place] LT BREAST BX W LOC DEV 1ST LESION IMG BX SPEC US GUIDE  Addendum Date: 07/08/2019   ADDENDUM REPORT: 07/08/2019 13:25 ADDENDUM: Pathology revealed GRADE I-II INVASIVE MAMMARY CARCINOMA of the Left breast, 2 o'clock. This was found to be concordant by Dr. Lovey Newcomer. Pathology results were discussed with the patient by telephone. The patient reported doing well after the biopsy with tenderness at the site. Post biopsy instructions and care were reviewed and questions were answered. The patient was encouraged to call [facility] for any additional concerns. The patient was referred to [facility] on Jul 15, 2019. Pathology results reported by Terie Purser, RN on 07/08/2019. Electronically Signed   By: Lovey Newcomer M.D.   On: 07/08/2019 13:25   Result Date: 07/08/2019 CLINICAL DATA:  Patient with indeterminate left breast mass 2 o'clock position. EXAM: ULTRASOUND GUIDED LEFT BREAST CORE NEEDLE BIOPSY COMPARISON:  Previous exam(s). PROCEDURE: I met with the patient and we discussed the procedure of ultrasound-guided biopsy, including benefits and alternatives. We discussed the high likelihood of a successful procedure. We discussed the risks of the procedure, including infection, bleeding, tissue injury, clip migration, and  inadequate sampling. Informed written consent was given. The usual time-out protocol was performed immediately prior to the procedure. Lesion quadrant: Upper outer quadrant Using sterile technique and 1% Lidocaine as local anesthetic, under direct ultrasound visualization, a 14 gauge spring-loaded device was used to perform biopsy of left breast mass 2 o'clock position using a lateral approach. At the conclusion of the procedure ribbon shaped tissue marker clip was  deployed into the biopsy cavity. Follow up 2 view mammogram was performed and dictated separately. IMPRESSION: Ultrasound guided biopsy of left breast mass 2 o'clock position. No apparent complications. Electronically Signed: By: Lovey Newcomer M.D. On: 07/07/2019 12:31    ASSESSMENT & PLAN:  Jasmine Shelton is a 49 y.o. Caucasian female with a history of    1. Malignant neoplasm of upper-outer quadrant of left breast, invasive lobular carcinoma stage IB, c(T2N0M0), ER/PR+/HER2-, Grade II  -We discussed her image findings and the biopsy results in great details. Although her 11/2018 Mammogram was clear she found her left breast mass in April 2021 and work up showed 2.5cm invasive lobular carcinoma. Given how fast this grow she likely has aggressive disease.  -Given her Lobular disease, Breast MRI is recommended for further evaluation, she is agreeable.  -Given the early stage disease, she likely need a lumpectomy with SLNB. She is agreeable with that. She was seen by Dr. Ninfa Linden today and likely will proceed with surgery soon. She is considering left mastectomy which she can discussed further with Dr Ninfa Linden but will not change there rest of her treatment except Radiation.  -I recommend a Oncotype Dx test on the surgical sample and we'll make a decision about adjuvant chemotherapy based on the Oncotype result. Written material of this test was given to her. She is young and fit, would be a good candidate for chemotherapy if her Oncotype  recurrence score is high. -If her surgical sentinel lymph node positive, I recommend mammaprint for further risk stratification and guide adjuvant chemotherapy. -The risk of recurrence depends on the stage and biology of the tumor. She is early stage, with ER/PR positive and HER2 negative markers. I discussed this is the second most common type of breast cancer -She was also seen by radiation oncologist Dr. Lisbeth Renshaw today. If her surgical sentinel lymph nodes were negative, she would not need post mastectomy radiation. Otherwise radiation is recommended to reduce the risk for local recurrence.  -Given the strong ER and PR expression in her postmenopausal status, I recommend adjuvant endocrine therapy with aromatase inhibitor Letrozole for a total of 7-10 years to reduce the risk of cancer recurrence. Potential benefits and side effects were discussed with patient and she is interested. -We also discussed the breast cancer surveillance after her surgery. She will continue annual screening mammogram, self exam, and a routine office visit with lab and exam with [place]. -I encouraged her to have healthy diet and exercise regularly -Labs reviewed, CBC and CMP WNL except BG 116. Physical exam shows 4x4.5cm mass in her 2-3:00 position, this is larger than scan indicated. She is not sure if her mass got bigger after biopsy. -f/u after surgery or RT   2. Hot Flashes  -Secondary to menopause  -She was on hormonal replacement for 2 years but stopped after breast cancer diagnosis. Hot flashes have increased since stopping  -Will watch on antiestrogen therapy.    3. Hyperglycemia  -She has had intermittently high BG in the past year.  -I recommend she f/u with PCP for A1c and possible management.    4. Genetic Testing  -Although she has no family history of cancer her diagnosis at age 25 makes her eligible for genetic testing. She is interested. I will send referral.    PLAN:  -Send genetic referral  -Breast  MRI in 1-2 weeks  -She will proceed with left lumpectomy and sentinel lymph node biopsy soon -Oncotype on her surgical sample, or MammaPrint if node positive -  I will see her after adjuvant radiation, or sooner if Oncotype shows high risk disease.   No orders of the defined types were placed in this encounter.   All questions were answered. The patient knows to call the clinic with any problems, questions or concerns. The total time spent in the appointment was 60 minutes.     Truitt Merle, MD 07/15/2019 6:11 PM  I, Joslyn Devon, am acting as scribe for Truitt Merle, MD.   I have reviewed the above documentation for accuracy and completeness, and I agree with the above.

## 2019-07-15 ENCOUNTER — Ambulatory Visit (HOSPITAL_BASED_OUTPATIENT_CLINIC_OR_DEPARTMENT_OTHER): Payer: 59 | Admitting: Licensed Clinical Social Worker

## 2019-07-15 ENCOUNTER — Ambulatory Visit
Admission: RE | Admit: 2019-07-15 | Discharge: 2019-07-15 | Disposition: A | Discharge status: Home / Self Care | Payer: 59 | Source: Ambulatory Visit | Attending: Radiation Oncology | Admitting: Radiation Oncology

## 2019-07-15 ENCOUNTER — Encounter: Payer: Self-pay | Admitting: Physical Therapy

## 2019-07-15 ENCOUNTER — Encounter: Payer: Self-pay | Admitting: General Practice

## 2019-07-15 ENCOUNTER — Encounter: Payer: Self-pay | Admitting: Hematology

## 2019-07-15 ENCOUNTER — Inpatient Hospital Stay: Payer: 59

## 2019-07-15 ENCOUNTER — Other Ambulatory Visit: Payer: Self-pay

## 2019-07-15 ENCOUNTER — Ambulatory Visit: Payer: 59 | Attending: Surgery | Admitting: Physical Therapy

## 2019-07-15 ENCOUNTER — Other Ambulatory Visit: Payer: Self-pay | Admitting: *Deleted

## 2019-07-15 ENCOUNTER — Encounter: Payer: Self-pay | Admitting: Licensed Clinical Social Worker

## 2019-07-15 ENCOUNTER — Inpatient Hospital Stay: Payer: 59 | Attending: Hematology | Admitting: Hematology

## 2019-07-15 ENCOUNTER — Encounter: Payer: Self-pay | Admitting: *Deleted

## 2019-07-15 VITALS — BP 147/84 | HR 69 | Temp 99.1°F | Resp 17 | Ht 63.0 in | Wt 130.7 lb

## 2019-07-15 DIAGNOSIS — Z8719 Personal history of other diseases of the digestive system: Secondary | ICD-10-CM | POA: Diagnosis not present

## 2019-07-15 DIAGNOSIS — Z800 Family history of malignant neoplasm of digestive organs: Secondary | ICD-10-CM | POA: Diagnosis not present

## 2019-07-15 DIAGNOSIS — Z170 Estrogen receptor positive status [ER+]: Secondary | ICD-10-CM

## 2019-07-15 DIAGNOSIS — R739 Hyperglycemia, unspecified: Secondary | ICD-10-CM | POA: Diagnosis not present

## 2019-07-15 DIAGNOSIS — C50412 Malignant neoplasm of upper-outer quadrant of left female breast: Secondary | ICD-10-CM | POA: Insufficient documentation

## 2019-07-15 DIAGNOSIS — R293 Abnormal posture: Secondary | ICD-10-CM | POA: Diagnosis present

## 2019-07-15 DIAGNOSIS — R232 Flushing: Secondary | ICD-10-CM | POA: Insufficient documentation

## 2019-07-15 DIAGNOSIS — Z87442 Personal history of urinary calculi: Secondary | ICD-10-CM | POA: Insufficient documentation

## 2019-07-15 LAB — CBC WITH DIFFERENTIAL (CANCER CENTER ONLY)
Abs Immature Granulocytes: 0.01 10*3/uL (ref 0.00–0.07)
Basophils Absolute: 0.1 10*3/uL (ref 0.0–0.1)
Basophils Relative: 1 %
Eosinophils Absolute: 0.1 10*3/uL (ref 0.0–0.5)
Eosinophils Relative: 1 %
HCT: 43.9 % (ref 36.0–46.0)
Hemoglobin: 14.9 g/dL (ref 12.0–15.0)
Immature Granulocytes: 0 %
Lymphocytes Relative: 48 %
Lymphs Abs: 2.8 10*3/uL (ref 0.7–4.0)
MCH: 31.4 pg (ref 26.0–34.0)
MCHC: 33.9 g/dL (ref 30.0–36.0)
MCV: 92.6 fL (ref 80.0–100.0)
Monocytes Absolute: 0.6 10*3/uL (ref 0.1–1.0)
Monocytes Relative: 9 %
Neutro Abs: 2.4 10*3/uL (ref 1.7–7.7)
Neutrophils Relative %: 41 %
Platelet Count: 329 10*3/uL (ref 150–400)
RBC: 4.74 MIL/uL (ref 3.87–5.11)
RDW: 12.4 % (ref 11.5–15.5)
WBC Count: 5.9 10*3/uL (ref 4.0–10.5)
nRBC: 0 % (ref 0.0–0.2)

## 2019-07-15 LAB — GENETIC SCREENING ORDER

## 2019-07-15 LAB — CMP (CANCER CENTER ONLY)
ALT: 14 U/L (ref 0–44)
AST: 14 U/L — ABNORMAL LOW (ref 15–41)
Albumin: 4 g/dL (ref 3.5–5.0)
Alkaline Phosphatase: 75 U/L (ref 38–126)
Anion gap: 8 (ref 5–15)
BUN: 15 mg/dL (ref 6–20)
CO2: 27 mmol/L (ref 22–32)
Calcium: 9.5 mg/dL (ref 8.9–10.3)
Chloride: 108 mmol/L (ref 98–111)
Creatinine: 0.82 mg/dL (ref 0.44–1.00)
GFR, Est AFR Am: >60 mL/min (ref >60)
GFR, Estimated: >60 mL/min (ref >60)
Glucose, Bld: 116 mg/dL — ABNORMAL HIGH (ref 70–99)
Potassium: 3.9 mmol/L (ref 3.5–5.1)
Sodium: 143 mmol/L (ref 135–145)
Total Bilirubin: 0.4 mg/dL (ref 0.3–1.2)
Total Protein: 7.4 g/dL (ref 6.5–8.1)

## 2019-07-15 NOTE — Patient Instructions (Signed)

## 2019-07-15 NOTE — Progress Notes (Signed)
REFERRING PROVIDER: Truitt Merle, MD [place],  [place] [address]  PRIMARY PROVIDER:  System, Pcp Not In  PRIMARY REASON FOR VISIT:  1. Malignant neoplasm of upper-outer quadrant of left breast in female, estrogen receptor positive ([place])   2. Family history of colon cancer     I connected with Jasmine Shelton on 07/15/2019 at 4:15 PM EDT by Webex and verified that I am speaking with the correct person using two identifiers.    Patient location: [facility] Provider location: [facility]  HISTORY OF PRESENT ILLNESS:   Jasmine Shelton, a 49 y.o. female, was seen for a [place] cancer genetics consultation at the request of Dr. Burr Medico due to a personal and family history of cancer.  Jasmine Shelton presents to clinic today to discuss the possibility of a hereditary predisposition to cancer, genetic testing, and to further clarify her future cancer risks, as well as potential cancer risks for family members.   In 2021, at the age of 75, Jasmine Shelton was diagnosed with invasive lobular carcinoma of the left breast, ER/PR+, Her2-. The treatment plan includes lumpectomy vs possible left mastectomy, possible chemotherapy and possible radiation.    CANCER HISTORY:  Oncology History Overview Note  Cancer Staging Malignant neoplasm of upper-outer quadrant of left breast in female, estrogen receptor positive ([place]) Staging form: Breast, AJCC 8th Edition - Clinical stage from 07/07/2019: Stage IB (cT2, cN0, cM0, G2, ER+, PR+, HER2-) - Signed by Truitt Merle, MD on 07/14/2019    Malignant neoplasm of upper-outer quadrant of left breast in female, estrogen receptor positive ([place])  07/07/2019 Mammogram   IMPRESSION: 1. There is a 2.5 x 1.9 x 0.8 cm suspicious mass in the left breast at 2'clock, 5cm fromnipple, likely corresponding with the area of distortion seen mammographically.   2.  No evidence of left axillary lymphadenopathy.   07/07/2019 Initial Biopsy   Diagnosis Breast, left, needle core  biopsy, 2 o'clock - INVASIVE MAMMARY CARCINOMA. - SEE COMMENT. Microscopic Comment The carcinoma appears grade 1-2. The longest span of tumor is 1.1 cm. An E-cadherin and a breast prognostic profile will be performed and the results reported separately. Dr. Vicente Males has reviewed the case and concurs with this interpretation. The results are called to [facility] on 07/08/2019. (JBK:gt, 07/08/19)   07/07/2019 Receptors her2   PROGNOSTIC INDICATORS Results: IMMUNOHISTOCHEMICAL AND MORPHOMETRIC ANALYSIS PERFORMED MANUALLY The tumor cells are NEGATIVE for Her2 (1+). Estrogen Receptor: 90%, POSITIVE, STRONG STAINING INTENSITY Progesterone Receptor: 95%, POSITIVE, STRONG STAINING INTENSITY Proliferation Marker Ki67: 5%   07/07/2019 Cancer Staging   Staging form: Breast, AJCC 8th Edition - Clinical stage from 07/07/2019: Stage IB (cT2, cN0, cM0, G2, ER+, PR+, HER2-) - Signed by Truitt Merle, MD on 07/14/2019   07/13/2019 Initial Diagnosis   Malignant neoplasm of upper-outer quadrant of left breast in female, estrogen receptor positive ([place])      RISK FACTORS:  Menarche was at age 83.  First live birth at age 73.  OCP use for approximately 9 years.  Menopausal status: postmenopausal.  HRT use: 1 years. Mammogram within the last year: yes. Number of breast biopsies: 1.  Past Medical History:  Diagnosis Date  . Breast cancer ([facility])   . Family history of colon cancer   . Kidney stones     Past Surgical History:  Procedure Laterality Date  . cold knife conization      Social History   Socioeconomic History  . Marital status: Married  Spouse name: Not on file  . Number of children: 2  . Years of education: Not on file  . Highest education level: Not on file  Occupational History  . Not on file  Tobacco Use  . Smoking status: Never Smoker  . Smokeless tobacco: Never Used  Substance and Sexual Activity  . Alcohol use: No  . Drug use: No  . Sexual  activity: Not Currently  Other Topics Concern  . Not on file  Social History Narrative  . Not on file   Social Determinants of Health   Financial Resource Strain:   . Difficulty of Paying Living Expenses:   Food Insecurity:   . Worried About Charity fundraiser in the Last Year:   . Arboriculturist in the Last Year:   Transportation Needs:   . Film/video editor (Medical):   Marland Kitchen Lack of Transportation (Non-Medical):   Physical Activity:   . Days of Exercise per Week:   . Minutes of Exercise per Session:   Stress:   . Feeling of Stress :   Social Connections:   . Frequency of Communication with Friends and Family:   . Frequency of Social Gatherings with Friends and Family:   . Attends Religious Services:   . Active Member of Clubs or Organizations:   . Attends Archivist Meetings:   Marland Kitchen Marital Status:      FAMILY HISTORY:  We obtained a detailed, 4-generation family history.  Significant diagnoses are listed below: Family History  Problem Relation Age of Onset  . Other Father        abdominal GIST dx 63  . Colon cancer Maternal Grandfather 40  . Cancer Maternal Uncle        blood cancer   Jasmine Shelton has a son, 43, and a daughter, 35. She has 2 brothers in their 79s, no cancers.   Jasmine Shelton mother is living at 62, no cancer history. Patient has 3 maternal aunts, 2 maternal uncles. One uncle has a blood cancer. No cancers in cousins. Maternal grandfather had colon cancer at 10 and died at 39. Maternal grandmother passed at 75.   Jasmine Shelton father is living at 49 and had an abdominal GIST at 77. Patient has 3 paternal aunts and 1 paternal uncle. No cancers. No cancers in paternal grandparents, both passed over the age of 17.   Jasmine Shelton is unaware of previous family history of genetic testing for hereditary cancer risks. Patient's maternal ancestors are of [place] and [place] descent, and paternal ancestors are of [place] ad [place]  descent.  There is no reported Ashkenazi Jewish ancestry. There is no known consanguinity.  GENETIC COUNSELING ASSESSMENT: Jasmine Shelton is a 49 y.o. female with a personal and family history which is somewhat suggestive of a hereditary cancer syndrome and predisposition to cancer. We, therefore, discussed and recommended the following at today's visit.   DISCUSSION: We discussed that 5 - 10% of breast cancer is hereditary, with most cases associated with BRCA1/BRCA2 mutations.  There are other genes that can be associated with hereditary breast cancer syndromes.   We discussed that testing is beneficial for several reasons including surgical decision-making for breast cancer, knowing how to follow individuals after completing their treatment, and understand if other family members could be at risk for cancer and allow them to undergo genetic testing.   We reviewed the characteristics, features and inheritance patterns of hereditary cancer syndromes. We also discussed genetic testing, including the  appropriate family members to test, the process of testing, insurance coverage and turn-around-time for results. We discussed the implications of a negative, positive and/or variant of uncertain significant result. In order to get genetic test results in a timely manner so that Jasmine Shelton can use these genetic test results for surgical decisions, we recommended Jasmine Shelton pursue genetic testing for the STAT Breast Cancer Panel. Once complete, we recommend Jasmine Shelton pursue reflex genetic testing to the Common Hereditary Cancers gene panel.   Based on Ms. Sellman's personal and family history of cancer, she does not meet criteria for testing. She will pay the patient pay price of $250 for the testing.   PLAN: After considering the risks, benefits, and limitations, Jasmine Shelton provided informed consent to pursue genetic testing and the blood sample was sent to [facility] for analysis of the STAT+ Common  Hereditary Cancers Panel. Results should be available within approximately 1 weeks' time, at which point they will be disclosed by telephone to Jasmine Shelton, as will any additional recommendations warranted by these results. Jasmine Shelton will receive a summary of her genetic counseling visit and a copy of her results once available. This information will also be available in Epic.     Jasmine Shelton questions were answered to her satisfaction today. Our contact information was provided should additional questions or concerns arise. Thank you for the referral and allowing [place] to share in the care of your patient.   Faith Rogue, MS, [facility] Genetic Counselor [email or web address] Phone: [phone]  The patient was seen for a total of 15 minutes in face-to-face genetic counseling.  Drs. Magrinat, Lindi Adie and/or Burr Medico were available for discussion regarding this case.   _______________________________________________________________________ For Office Staff:  Number of people involved in session: 2 Was an Intern/ student involved with case: no

## 2019-07-15 NOTE — Progress Notes (Signed)
[place] Psychosocial Distress Screening Spiritual Care  Met with Lizmarie and her husband in [facility] to introduce [facility] team/resources, reviewing distress screen per protocol.  The patient scored a 4 on the Psychosocial Distress Thermometer which indicates mild distress. Also assessed for distress and other psychosocial needs.   ONCBCN DISTRESS SCREENING 07/15/2019  Screening Type Initial Screening  Distress experienced in past week (1-10) 4  Practical problem type Work/school  Emotional problem type Nervousness/Anxiety;Adjusting to illness  Information Concerns Type Lack of info about diagnosis;Lack of info about treatment  Referral to support programs Yes   Estell has children ages 59 and 60 who are aware of diagnosis. Now that her information concerns have been resolved through [facility], her biggest concern is fielding other people's anxiety about her diagnosis. Normalized feelings, providing empathic listening and emotional support.   Follow up needed: No. Per couple, no other needs or concerns at this time, but they are aware of ongoing [place] team/programming availability. Please also page if needs arise or circumstances change. Thank you.   [place], [place], [facility] Pager [phone] Voicemail [phone]

## 2019-07-15 NOTE — Therapy (Signed)
[place], [place], [address] Phone: [phone]   Fax:  [phone]  Physical Therapy Evaluation  Patient Details  Name: Jasmine Shelton MRN: [id number] Date of Birth: 01-26-1971 Referring Provider (PT): Dr. Coralie Keens   Encounter Date: 07/15/2019  PT End of Session - 07/15/19 1553    Visit Number  1    Number of Visits  2    Date for PT Re-Evaluation  09/09/19    PT Start Time  1302    PT Stop Time  1334    PT Time Calculation (min)  32 min    Activity Tolerance  Patient tolerated treatment well    Behavior During Therapy  [facility] for tasks assessed/performed       Past Medical History:  Diagnosis Date  . Breast cancer ([place])   . Kidney stones     Past Surgical History:  Procedure Laterality Date  . cold knife conization      There were no vitals filed for this visit.   Subjective Assessment - 07/15/19 1539    Subjective  Patient reports she is here today to be seen by her medical team for her newly diagnosed left breast cancer.    Patient is accompained by:  Family member    Pertinent History  Patient was diagnosed on 07/07/2019 with left grade I-II invasive lobular carcinoma breast cancer. It measures 2.5 cm and is located in the upper outer quadrant. It is ER/PR positive and HER2 negative with a Ki67 of 5%.    Patient Stated Goals  Learn post op shoulder ROM HEP and decrease risk for lymphedema    Currently in Pain?  No/denies         [facility] PT Assessment - 07/15/19 0001      Assessment   Medical Diagnosis  Left breast cancer    Referring Provider (PT)  Dr. Coralie Keens    Onset Date/Surgical Date  07/07/19    Hand Dominance  Right    Prior Therapy  none      Precautions   Precautions  Other (comment)    Precaution Comments  active cancer      Restrictions   Weight Bearing Restrictions  No      Balance Screen   Has the patient fallen in the past 6 months  No    Has the patient had a decrease in activity level because of a fear of falling?   No    Is the patient reluctant to leave their home because of a fear of falling?   No      Home Environment   Living Environment  Private residence    Living Arrangements  Spouse/significant other;Children   Husband and 36 y.o. son   Available Help at Discharge  Family      Prior Function   Level of Independence  Independent    Vocation  Full time employment    Psychologist, sport and exercise    Leisure  She does not exercise      Cognition   Overall Cognitive Status  Within Functional Limits for tasks assessed      Posture/Postural Control   Posture/Postural Control  Postural limitations    Postural Limitations  Rounded Shoulders;Forward head      ROM / Strength   AROM / PROM / Strength  AROM;Strength      AROM   Overall AROM Comments  Cervical AROM is WNL    AROM Assessment Site  Shoulder    Right/Left Shoulder  Right;Left    Right Shoulder Extension  59 Degrees    Right Shoulder Flexion  148 Degrees    Right Shoulder ABduction  159 Degrees    Right Shoulder Internal Rotation  68 Degrees    Right Shoulder External Rotation  83 Degrees    Left Shoulder Extension  57 Degrees    Left Shoulder Flexion  147 Degrees    Left Shoulder ABduction  162 Degrees    Left Shoulder Internal Rotation  70 Degrees    Left Shoulder External Rotation  89 Degrees      Strength   Overall Strength  Within functional limits for tasks performed        LYMPHEDEMA/ONCOLOGY QUESTIONNAIRE - 07/15/19 1546      Type   Cancer Type  Left breast cancer      Lymphedema Assessments   Lymphedema Assessments  Upper extremities      Right Upper Extremity Lymphedema   10 cm Proximal to Olecranon Process  23.8 cm    Olecranon Process  22.5 cm    10 cm Proximal to Ulnar Styloid Process  20.4 cm    Just Proximal to Ulnar Styloid Process  15.1 cm    Across Hand at [employer]  18.4 cm    At [place] of 2nd Digit  5.9  cm      Left Upper Extremity Lymphedema   10 cm Proximal to Olecranon Process  23.9 cm    Olecranon Process  22.8 cm    10 cm Proximal to Ulnar Styloid Process  19.7 cm    Just Proximal to Ulnar Styloid Process  15 cm    Across Hand at [employer]  18.9 cm    At [place] of 2nd Digit  5.9 cm      L-DEX FLOWSHEETS - 07/15/19 1500      L-DEX LYMPHEDEMA SCREENING   Measurement Type  Unilateral    L-DEX MEASUREMENT EXTREMITY  Upper Extremity    POSITION   Standing    DOMINANT SIDE  Left    At Risk Side  Left    BASELINE SCORE (UNILATERAL)  -5.4           Quick Dash - 07/15/19 0001    Open a tight or new jar  No difficulty    Do heavy household chores (wash walls, wash floors)  No difficulty    Carry a shopping bag or briefcase  No difficulty    Wash your back  No difficulty    Use a knife to cut food  No difficulty    Recreational activities in which you take some force or impact through your arm, shoulder, or hand (golf, hammering, tennis)  No difficulty    During the past week, to what extent has your arm, shoulder or hand problem interfered with your normal social activities with family, friends, neighbors, or groups?  Not at all    During the past week, to what extent has your arm, shoulder or hand problem limited your work or other regular daily activities  Not at all    Arm, shoulder, or hand pain.  None    Tingling (pins and needles) in your arm, shoulder, or hand  None    Difficulty Sleeping  Mild difficulty    DASH Score  2.27 %        Objective measurements completed on examination: See above findings.  Patient was instructed today in a home exercise program today for post op shoulder range of motion. These included active assist shoulder flexion in sitting, scapular retraction, wall walking with shoulder abduction, and hands behind head external rotation.  She was encouraged to do these twice a day, holding 3 seconds and repeating 5 times when  permitted by her physician.        PT Education - 07/15/19 1548    Education Details  Lymphedema risk reduction and post op shoulder ROM HEP    Person(s) Educated  Patient;Spouse    Methods  Explanation;Demonstration;Handout    Comprehension  Verbalized understanding;Returned demonstration          PT Long Term Goals - 07/15/19 1602      PT LONG TERM GOAL #1   Title  Patient will demonstrate she has regained full shoulder ROM and function post operatively compared to baseline measurements.    Time  [facility] Goals - 07/15/19 1602      Patient will be able to verbalize understanding of pertinent lymphedema risk reduction practices relevant to her diagnosis specifically related to skin care.   Time  1    Period  Days    Status  Achieved      Patient will be able to return demonstrate and/or verbalize understanding of the post-op home exercise program related to regaining shoulder range of motion.   Time  1    Period  Days    Status  Achieved      Patient will be able to verbalize understanding of the importance of attending the postoperative After Breast Cancer Class for further lymphedema risk reduction education and therapeutic exercise.   Time  1    Period  Days    Status  Achieved            Plan - 07/15/19 1554    Clinical Impression Statement  Patient was diagnosed on 07/07/2019 with left grade I-II invasive lobular carcinoma breast cancer. It measures 2.5 cm and is located in the upper outer quadrant. It is ER/PR positive and HER2 negative with a Ki67 of 5%. Her multidisciplinary medical team met prior to her assessments to determine a recommended treatment plan. She is planning to have a left lumpectomy and sentinel node biopsy followed by Oncotype testing, radiation, and anti-estrogen therapy. She will benefit from a post op PT reassessment to determine needs and from L-Dex screenings every 3 months for 2 years to  detect subclinical lymphedema.    Stability/Clinical Decision Making  Stable/Uncomplicated    Clinical Decision Making  Low    Rehab Potential  Excellent    PT Frequency  --   Eval and 1 f/u visit followed by L-Dex screenings every 3 months   PT Treatment/Interventions  ADLs/Self Care Home Management;Therapeutic exercise;Patient/family education    PT Next Visit Plan  Will reassess every 3-4 weeks post op    PT Home Exercise Plan  Post op shoulder ROM HEP    Consulted and Agree with Plan of Care  Patient;Family member/caregiver    Family Member Consulted  Husband       Patient will benefit from skilled therapeutic intervention in order to improve the following deficits and impairments:  Postural dysfunction, Decreased range of motion, Decreased knowledge of precautions, Impaired UE functional use, Pain  Visit Diagnosis: Malignant neoplasm of upper-outer quadrant of left breast in female,  estrogen receptor positive ([place]) - Plan: PT plan of care cert/re-cert  Abnormal posture - Plan: PT plan of care cert/re-cert   Patient will follow up at outpatient cancer rehab 3-4 weeks following surgery.  If the patient requires physical therapy at that time, a specific plan will be dictated and sent to the referring physician for approval. The patient was educated today on appropriate basic range of motion exercises to begin post operatively and the importance of attending the After Breast Cancer class following surgery.  Patient was educated today on lymphedema risk reduction practices as it pertains to recommendations that will benefit the patient immediately following surgery.  She verbalized good understanding.      Problem List Patient Active Problem List   Diagnosis Date Noted  . Malignant neoplasm of upper-outer quadrant of left breast in female, estrogen receptor positive ([place]) 07/13/2019   Annia Friendly, PT 07/15/19 4:05 PM  [place], [place], [address] Phone: [phone]   Fax:  [phone]  Name: Brigetta Beckstrom MRN: [id number] Date of Birth: March 06, 1970

## 2019-07-16 NOTE — Progress Notes (Signed)
Radiation Oncology         [phone]) [phone] ________________________________  Name: Jasmine Shelton        MRN: [id number]  Date of Service: 07/15/2019 DOB: April 25, 1970  XE:[id number], Pcp Not In  Coralie Keens, MD     REFERRING PHYSICIAN: Coralie Keens, MD   DIAGNOSIS: The encounter diagnosis was Malignant neoplasm of upper-outer quadrant of left breast in female, estrogen receptor positive ([place]).   HISTORY OF PRESENT ILLNESS: Jasmine Shelton is a 49 y.o. female seen in the multidisciplinary breast clinic for a new diagnosis of left breast cancer. The patient was noted to have a palpable mass in the left breast when she had prior mammogram in the fall of 2020 that was normal.  She proceeded with further diagnostic imaging that revealed a 2.5 x 1.9 5.8 cm mass in the left breast at 2:00.  Her axilla was negative for adenopathy, and a biopsy on 07/07/2019 revealed a grade 1-2 invasive lobular carcinoma that was ER/PR positive, HER-2 was negative and her Ki-67 was 5%.  She is seen today to discuss treatment recommendations for her cancer.    PREVIOUS RADIATION THERAPY: No   PAST MEDICAL HISTORY:  Past Medical History:  Diagnosis Date  . Breast cancer ([place])   . Family history of colon cancer   . Kidney stones        PAST SURGICAL HISTORY: Past Surgical History:  Procedure Laterality Date  . cold knife conization       FAMILY HISTORY:  Family History  Problem Relation Age of Onset  . Other Father        abdominal GIST dx 26  . Colon cancer Maternal Grandfather 36  . Cancer Maternal Uncle        blood cancer     SOCIAL HISTORY:  reports that she has never smoked. She has never used smokeless tobacco. She reports that she does not drink alcohol or use drugs.  The patient is married and accompanied by her husband.  They live in [facility].  They have a daughter in college in [place] at [employer], and their son is graduating from high school this year and  plans to go to her Advice [employer], [place].  She works as an Optometrist.    ALLERGIES: Patient has no known allergies.   MEDICATIONS:  No current outpatient medications on file.   No current facility-administered medications for this encounter.     REVIEW OF SYSTEMS: On review of systems, the patient reports that she is doing well overall.  She feels more reassured following the discussion today regarding recommendations for her treatment.  No other complaints are verbalized.     PHYSICAL EXAM:  Wt Readings from Last 3 Encounters:  07/15/19 130 lb 11.2 oz (59.3 kg)  01/10/19 128 lb (58.1 kg)  11/02/18 126 lb (57.2 kg)   Temp Readings from Last 3 Encounters:  07/15/19 99.1 F (37.3 C) (Temporal)  01/10/19 98.3 F (36.8 C) (Oral)  11/02/18 98.5 F (36.9 C) (Oral)   BP Readings from Last 3 Encounters:  07/15/19 (!) 147/84  01/10/19 (!) 170/93  11/02/18 121/65   Pulse Readings from Last 3 Encounters:  07/15/19 69  01/10/19 73  11/02/18 65    In general this is a well appearing Caucasian female in no acute distress. She's alert and oriented x4 and appropriate throughout the examination. Cardiopulmonary assessment is negative for acute distress and she exhibits normal effort. Bilateral breast exam is deferred.  ECOG = 0  0 - Asymptomatic (Fully active, able to carry on all predisease activities without restriction)  1 - Symptomatic but completely ambulatory (Restricted in physically strenuous activity but ambulatory and able to carry out work of a light or sedentary nature. For example, light housework, office work)  2 - Symptomatic, <50% in bed during the day (Ambulatory and capable of all self care but unable to carry out any work activities. Up and about more than 50% of waking hours)  3 - Symptomatic, >50% in bed, but not bedbound (Capable of only limited self-care, confined to bed or chair 50% or more of waking hours)  4 - Bedbound (Completely disabled. Cannot  carry on any self-care. Totally confined to bed or chair)  5 - Death   Jasmine Shelton, Creech RH, Tormey DC, et al. [phone]). "Toxicity and response criteria of the [facility] Group". [place] Oncol. 5 (6): 649-55    LABORATORY DATA:  Lab Results  Component Value Date   WBC 5.9 07/15/2019   HGB 14.9 07/15/2019   HCT 43.9 07/15/2019   MCV 92.6 07/15/2019   PLT 329 07/15/2019   Lab Results  Component Value Date   NA 143 07/15/2019   K 3.9 07/15/2019   CL 108 07/15/2019   CO2 27 07/15/2019   Lab Results  Component Value Date   ALT 14 07/15/2019   AST 14 (L) 07/15/2019   ALKPHOS 75 07/15/2019   BILITOT 0.4 07/15/2019      RADIOGRAPHY: US BREAST LTD UNI LEFT INC AXILLA  Result Date: 07/07/2019 CLINICAL DATA:  49 year old female presenting for evaluation of a palpable lump in the upper outer left breast about 3 weeks. EXAM: DIGITAL DIAGNOSTIC UNILATERAL LEFT MAMMOGRAM WITH CAD AND TOMO LEFT BREAST ULTRASOUND COMPARISON:  Previous exam(s). ACR Breast Density Category c: The breast tissue is heterogeneously dense, which may obscure small masses. FINDINGS: A BB has been placed at the palpable site of concern in the upper-outer quadrant of the left breast. Deep to the palpable marker there is a broad area of distortion. Mammographic images were processed with CAD. Physical exam of the lateral left breast demonstrates a firm palpable lump spanning approximately 5 cm. Ultrasound targeted to the left breast at 2 o'clock 5 cm nipple demonstrates an ill-defined area of shadowing measuring 2.5 x 1.9 x 0.8 cm. Ultrasound of the left axilla demonstrates normal-appearing lymph nodes. IMPRESSION: 1. There is a 2.5 cm suspicious mass in the left breast at 2 o'clock, likely corresponding with the area of distortion seen mammographically. 2.  No evidence of left axillary lymphadenopathy. RECOMMENDATION: Ultrasound guided biopsy is recommended for the left breast mass. This has been scheduled  for 07/07/2019 at 11:30 a.m. I have discussed the findings and recommendations with the patient. If applicable, a reminder letter will be sent to the patient regarding the next appointment. BI-RADS CATEGORY  5: Highly suggestive of malignancy. Electronically Signed   By: Ammie Ferrier M.D.   On: 07/07/2019 09:04   [place] DIAG BREAST TOMO UNI LEFT  Result Date: 07/07/2019 CLINICAL DATA:  49 year old female presenting for evaluation of a palpable lump in the upper outer left breast about 3 weeks. EXAM: DIGITAL DIAGNOSTIC UNILATERAL LEFT MAMMOGRAM WITH CAD AND TOMO LEFT BREAST ULTRASOUND COMPARISON:  Previous exam(s). ACR Breast Density Category c: The breast tissue is heterogeneously dense, which may obscure small masses. FINDINGS: A BB has been placed at the palpable site of concern in the upper-outer quadrant of the left breast.  Deep to the palpable marker there is a broad area of distortion. Mammographic images were processed with CAD. Physical exam of the lateral left breast demonstrates a firm palpable lump spanning approximately 5 cm. Ultrasound targeted to the left breast at 2 o'clock 5 cm nipple demonstrates an ill-defined area of shadowing measuring 2.5 x 1.9 x 0.8 cm. Ultrasound of the left axilla demonstrates normal-appearing lymph nodes. IMPRESSION: 1. There is a 2.5 cm suspicious mass in the left breast at 2 o'clock, likely corresponding with the area of distortion seen mammographically. 2.  No evidence of left axillary lymphadenopathy. RECOMMENDATION: Ultrasound guided biopsy is recommended for the left breast mass. This has been scheduled for 07/07/2019 at 11:30 a.m. I have discussed the findings and recommendations with the patient. If applicable, a reminder letter will be sent to the patient regarding the next appointment. BI-RADS CATEGORY  5: Highly suggestive of malignancy. Electronically Signed   By: Ammie Ferrier M.D.   On: 07/07/2019 09:04   [place]hel[place] CLIP PLACEMENT LEFT  Result Date:  07/07/2019 CLINICAL DATA:  Patient status post ultrasound-guided biopsy left breast mass 2 o'clock position. EXAM: DIAGNOSTIC LEFT MAMMOGRAM POST ULTRASOUND BIOPSY COMPARISON:  Previous exam(s). FINDINGS: Mammographic images were obtained following ultrasound guided biopsy of left breast mass 2 o'clock position. The biopsy marking clip is in expected position at the site of biopsy. IMPRESSION: Appropriate positioning of the ribbon shaped biopsy marking clip at the site of biopsy in the left breast mass 2 o'clock position. Final Assessment: Post Procedure Mammograms for Marker Placement Electronically Signed   By: Lovey Newcomer M.D.   On: 07/07/2019 12:32   [place] LT BREAST BX W LOC DEV 1ST LESION IMG BX SPEC US GUIDE  Addendum Date: 07/08/2019   ADDENDUM REPORT: 07/08/2019 13:25 ADDENDUM: Pathology revealed GRADE I-II INVASIVE MAMMARY CARCINOMA of the Left breast, 2 o'clock. This was found to be concordant by Dr. Lovey Newcomer. Pathology results were discussed with the patient by telephone. The patient reported doing well after the biopsy with tenderness at the site. Post biopsy instructions and care were reviewed and questions were answered. The patient was encouraged to call [facility] for any additional concerns. The patient was referred to [facility] on Jul 15, 2019. Pathology results reported by Terie Purser, RN on 07/08/2019. Electronically Signed   By: Lovey Newcomer M.D.   On: 07/08/2019 13:25   Result Date: 07/08/2019 CLINICAL DATA:  Patient with indeterminate left breast mass 2 o'clock position. EXAM: ULTRASOUND GUIDED LEFT BREAST CORE NEEDLE BIOPSY COMPARISON:  Previous exam(s). PROCEDURE: I met with the patient and we discussed the procedure of ultrasound-guided biopsy, including benefits and alternatives. We discussed the high likelihood of a successful procedure. We discussed the risks of the procedure,  including infection, bleeding, tissue injury, clip migration, and inadequate sampling. Informed written consent was given. The usual time-out protocol was performed immediately prior to the procedure. Lesion quadrant: Upper outer quadrant Using sterile technique and 1% Lidocaine as local anesthetic, under direct ultrasound visualization, a 14 gauge spring-loaded device was used to perform biopsy of left breast mass 2 o'clock position using a lateral approach. At the conclusion of the procedure ribbon shaped tissue marker clip was deployed into the biopsy cavity. Follow up 2 view mammogram was performed and dictated separately. IMPRESSION: Ultrasound guided biopsy of left breast mass 2 o'clock position. No apparent complications. Electronically Signed: By: Lovey Newcomer M.D. On: 07/07/2019  12:31       IMPRESSION/PLAN: 1. Stage IB, cT2N0M0, grade 2 ER/PR positive invasive lobular carcinoma of the left breast. Dr. Lisbeth Renshaw discusses the pathology findings and reviews the nature of left-sided breast disease. The consensus from the breast conference includes proceeding with MRI for extent of disease, especially given that Dr. Ernestina Penna concern that there may be some bleeding within the mass that made it feel larger on her exam.  The hope however would be to offer breast conservation with lumpectomy and sentinel node biopsy. Dr. Burr Medico recommends Oncotype Dx score to determine a role for systemic therapy. Provided that chemotherapy is not indicated, the patient's course would then be followed by external radiotherapy to the breast followed by antiestrogen therapy. We discussed the risks, benefits, short, and long term effects of radiotherapy, and the patient is interested in proceeding. Dr. Lisbeth Renshaw discusses the delivery and logistics of radiotherapy and anticipates a course of 4-6 1/2 weeks of radiotherapy if she proceeds with breast conservation. We will see her back about 2 weeks after surgery to discuss the simulation  process and anticipate we starting radiotherapy about 4-6 weeks after surgery.  2. Possible genetic predisposition to malignancy. The patient is a candidate for genetic testing given her personal and family history.  She was offered referral and she will meet with genetics today. 3. Contraceptive counseling.  The patient has been menopausal for the last year and has been on hormone replacement therapy during that time, she recently discontinued this following her diagnosis.  In a visit lasting 60 minutes, greater than 50% of the time was spent face to face reviewing her case, as well as in preparation of, discussing, and coordinating the patient's care.  The above documentation reflects my direct findings during this shared patient visit. Please see the separate note by Dr. Lisbeth Renshaw on this date for the remainder of the patient's plan of care.    Carola Rhine, PAC

## 2019-07-22 ENCOUNTER — Telehealth: Payer: Self-pay | Admitting: *Deleted

## 2019-07-22 ENCOUNTER — Encounter: Payer: Self-pay | Admitting: Licensed Clinical Social Worker

## 2019-07-22 ENCOUNTER — Encounter: Payer: Self-pay | Admitting: *Deleted

## 2019-07-22 ENCOUNTER — Telehealth: Payer: Self-pay | Admitting: Licensed Clinical Social Worker

## 2019-07-22 ENCOUNTER — Ambulatory Visit: Payer: Self-pay | Admitting: Licensed Clinical Social Worker

## 2019-07-22 DIAGNOSIS — Z1379 Encounter for other screening for genetic and chromosomal anomalies: Secondary | ICD-10-CM

## 2019-07-22 DIAGNOSIS — C50412 Malignant neoplasm of upper-outer quadrant of left female breast: Secondary | ICD-10-CM

## 2019-07-22 DIAGNOSIS — Z800 Family history of malignant neoplasm of digestive organs: Secondary | ICD-10-CM

## 2019-07-22 NOTE — Telephone Encounter (Signed)
Revealed negative genetic testing.   We discussed that we do not know why she has breast cancer or why there is cancer in the family. It could be due to a different gene that we are not testing, or something our current technology cannot pick up.  It will be important for her to keep in contact with genetics to learn if additional testing may be needed in the future.  

## 2019-07-22 NOTE — Telephone Encounter (Signed)
Spoke to pt concerning [place] from 5.26.21. Denies questions or concerns regarding dx or treatment care plan. Encourage pt to call with needs. Received verbal understanding. Contact information provided.

## 2019-07-22 NOTE — Progress Notes (Signed)
HPI:  Jasmine Shelton was previously seen in the [place] clinic due to a personal and family history of cancer and concerns regarding a hereditary predisposition to cancer. Please refer to our prior cancer genetics clinic note for more information regarding our discussion, assessment and recommendations, at the time. Jasmine Shelton recent genetic test results were disclosed to her, as were recommendations warranted by these results. These results and recommendations are discussed in more detail below.  CANCER HISTORY:  Oncology History Overview Note  Cancer Staging Malignant neoplasm of upper-outer quadrant of left breast in female, estrogen receptor positive ([place]) Staging form: Breast, AJCC 8th Edition - Clinical stage from 07/07/2019: Stage IB (cT2, cN0, cM0, G2, ER+, PR+, HER2-) - Signed by Truitt Merle, MD on 07/14/2019    Malignant neoplasm of upper-outer quadrant of left breast in female, estrogen receptor positive ([place])  07/07/2019 Mammogram   IMPRESSION: 1. There is a 2.5 x 1.9 x 0.8 cm suspicious mass in the left breast at 2'clock, 5cm fromnipple, likely corresponding with the area of distortion seen mammographically.   2.  No evidence of left axillary lymphadenopathy.   07/07/2019 Initial Biopsy   Diagnosis Breast, left, needle core biopsy, 2 o'clock - INVASIVE MAMMARY CARCINOMA. - SEE COMMENT. Microscopic Comment The carcinoma appears grade 1-2. The longest span of tumor is 1.1 cm. An E-cadherin and a breast prognostic profile will be performed and the results reported separately. Dr. Vicente Males has reviewed the case and concurs with this interpretation. The results are called to [facility] on 07/08/2019. (JBK:gt, 07/08/19)   07/07/2019 Receptors her2   PROGNOSTIC INDICATORS Results: IMMUNOHISTOCHEMICAL AND MORPHOMETRIC ANALYSIS PERFORMED MANUALLY The tumor cells are NEGATIVE for Her2 (1+). Estrogen Receptor: 90%, POSITIVE, STRONG STAINING  INTENSITY Progesterone Receptor: 95%, POSITIVE, STRONG STAINING INTENSITY Proliferation Marker Ki67: 5%   07/07/2019 Cancer Staging   Staging form: Breast, AJCC 8th Edition - Clinical stage from 07/07/2019: Stage IB (cT2, cN0, cM0, G2, ER+, PR+, HER2-) - Signed by Truitt Merle, MD on 07/14/2019   07/13/2019 Initial Diagnosis   Malignant neoplasm of upper-outer quadrant of left breast in female, estrogen receptor positive ([place])   07/22/2019 Genetic Testing   Negative genetic testing. No pathogenic variants identified on the [facility] STAT Breast Cancer Panel + Common Hereditary Cancers Panel. The report date is 07/22/2019.  The STAT Breast cancer panel offered by [facility] includes sequencing and rearrangement analysis for the following 9 genes:  ATM, BRCA1, BRCA2, CDH1, CHEK2, PALB2, PTEN, STK11 and TP53.    The Common Hereditary Cancers Panel offered by [facility] includes sequencing and/or deletion duplication testing of the following 48 genes: APC, ATM, AXIN2, BARD1, BMPR1A, BRCA1, BRCA2, BRIP1, CDH1, CDKN2A (p14ARF), CDKN2A (p16INK4a), CKD4, CHEK2, CTNNA1, DICER1, EPCAM (Deletion/duplication testing only), GREM1 (promoter region deletion/duplication testing only), KIT, MEN1, MLH1, MSH2, MSH3, MSH6, MUTYH, NBN, NF1, NHTL1, PALB2, PDGFRA, PMS2, POLD1, POLE, PTEN, RAD50, RAD51C, RAD51D, RNF43, SDHB, SDHC, SDHD, SMAD4, SMARCA4. STK11, TP53, TSC1, TSC2, and VHL.  The following genes were evaluated for sequence changes only: SDHA and HOXB13 c.251G>A variant only.     FAMILY HISTORY:  We obtained a detailed, 4-generation family history.  Significant diagnoses are listed below: Family History  Problem Relation Age of Onset  . Other Father        abdominal GIST dx 13  . Colon cancer Maternal Grandfather 52  . Cancer Maternal Uncle        blood cancer   Jasmine Shelton has a son, 16,  and a daughter, 26. She has 2 brothers in their 42s, no cancers.   Jasmine Shelton mother is living at 53, no cancer history.  Patient has 3 maternal aunts, 2 maternal uncles. One uncle has a blood cancer. No cancers in cousins. Maternal grandfather had colon cancer at 63 and died at 38. Maternal grandmother passed at 63.   Jasmine Shelton's father is living at 62 and had an abdominal GIST at 49. Patient has 3 paternal aunts and 1 paternal uncle. No cancers. No cancers in paternal grandparents, both passed over the age of 44.   Jasmine Shelton is unaware of previous family history of genetic testing for hereditary cancer risks. Patient's maternal ancestors are of [place] and [place] descent, and paternal ancestors are of [place] ad [place]  descent. There is no reported Ashkenazi Jewish ancestry. There is no known consanguinity.   GENETIC TEST RESULTS: Genetic testing reported out on 07/22/2019 through the [facility] Breast Cancer STAT Panel + Common Hereditary cancer panel found no pathogenic mutations.   The STAT Breast cancer panel offered by [facility] includes sequencing and rearrangement analysis for the following 9 genes:  ATM, BRCA1, BRCA2, CDH1, CHEK2, PALB2, PTEN, STK11 and TP53.    The Common Hereditary Cancers Panel offered by [facility] includes sequencing and/or deletion duplication testing of the following 48 genes: APC, ATM, AXIN2, BARD1, BMPR1A, BRCA1, BRCA2, BRIP1, CDH1, CDKN2A (p14ARF), CDKN2A (p16INK4a), CKD4, CHEK2, CTNNA1, DICER1, EPCAM (Deletion/duplication testing only), GREM1 (promoter region deletion/duplication testing only), KIT, MEN1, MLH1, MSH2, MSH3, MSH6, MUTYH, NBN, NF1, NHTL1, PALB2, PDGFRA, PMS2, POLD1, POLE, PTEN, RAD50, RAD51C, RAD51D, RNF43, SDHB, SDHC, SDHD, SMAD4, SMARCA4. STK11, TP53, TSC1, TSC2, and VHL.  The following genes were evaluated for sequence changes only: SDHA and HOXB13 c.251G>A variant only.  The test report has been scanned into EPIC and is located under the Molecular Pathology section of the Results Review tab.  A portion of the result report is included below for reference.      We discussed with Jasmine Shelton that because current genetic testing is not perfect, it is possible there may be a gene mutation in one of these genes that current testing cannot detect, but that chance is small.  We also discussed, that there could be another gene that has not yet been discovered, or that we have not yet tested, that is responsible for the cancer diagnoses in the family. It is also possible there is a hereditary cause for the cancer in the family that Jasmine Shelton did not inherit and therefore was not identified in her testing.  Therefore, it is important to remain in touch with cancer genetics in the future so that we can continue to offer Jasmine Shelton the most up to date genetic testing.   ADDITIONAL GENETIC TESTING: We discussed with Jasmine Shelton that her genetic testing was fairly extensive.  If there are genes identified to increase cancer risk that can be analyzed in the future, we would be happy to discuss and coordinate this testing at that time.    CANCER SCREENING RECOMMENDATIONS: Jasmine Shelton's test result is considered negative (normal).  This means that we have not identified a hereditary cause for her  personal and family history of cancer at this time. Most cancers happen by chance and this negative test suggests that her cancer may fall into this category.    While reassuring, this does not definitively rule out a hereditary predisposition to cancer. It is still possible that there could be genetic mutations that  are undetectable by current technology. There could be genetic mutations in genes that have not been tested or identified to increase cancer risk.  Therefore, it is recommended she continue to follow the cancer management and screening guidelines provided by her oncology and primary healthcare provider.   An individual's cancer risk and medical management are not determined by genetic test results alone. Overall cancer risk assessment incorporates additional  factors, including personal medical history, family history, and any available genetic information that may result in a personalized plan for cancer prevention and surveillance.  RECOMMENDATIONS FOR FAMILY MEMBERS:  Relatives in this family might be at some increased risk of developing cancer, over the general population risk, simply due to the family history of cancer.  We recommended female relatives in this family have a yearly mammogram beginning at age 31, or 22 years younger than the earliest onset of cancer, an annual clinical breast exam, and perform monthly breast self-exams. Female relatives in this family should also have a gynecological exam as recommended by their primary provider. All family members should have a colonoscopy by age 79, or as directed by their physicians.  FOLLOW-UP: Lastly, we discussed with Jasmine Shelton that cancer genetics is a rapidly advancing field and it is possible that new genetic tests will be appropriate for her and/or her family members in the future. We encouraged her to remain in contact with cancer genetics on an annual basis so we can update her personal and family histories and let her know of advances in cancer genetics that may benefit this family.   Our contact number was provided. Jasmine Shelton questions were answered to her satisfaction, and she knows she is welcome to call us at anytime with additional questions or concerns.   Faith Rogue, MS, [facility] Genetic Counselor [email or web address] Phone: [phone]

## 2019-07-25 ENCOUNTER — Ambulatory Visit
Admission: RE | Admit: 2019-07-25 | Discharge: 2019-07-25 | Disposition: A | Discharge status: Home / Self Care | Payer: 59 | Source: Ambulatory Visit | Attending: Surgery | Admitting: Surgery

## 2019-07-25 DIAGNOSIS — Z170 Estrogen receptor positive status [ER+]: Secondary | ICD-10-CM

## 2019-07-25 MED ORDER — GADOBUTROL 1 MMOL/ML IV SOLN
6.0000 mL | Freq: Once | INTRAVENOUS | Status: AC | PRN
  Administered 2019-07-25: 6 mL via INTRAVENOUS

## 2019-07-27 ENCOUNTER — Encounter: Payer: Self-pay | Admitting: *Deleted

## 2019-07-28 ENCOUNTER — Other Ambulatory Visit: Payer: Self-pay | Admitting: Surgery

## 2019-07-28 DIAGNOSIS — R9389 Abnormal findings on diagnostic imaging of other specified body structures: Secondary | ICD-10-CM

## 2019-07-29 ENCOUNTER — Ambulatory Visit
Admission: RE | Admit: 2019-07-29 | Discharge: 2019-07-29 | Disposition: A | Discharge status: Home / Self Care | Payer: 59 | Source: Ambulatory Visit | Attending: Surgery | Admitting: Surgery

## 2019-07-29 ENCOUNTER — Other Ambulatory Visit: Payer: Self-pay

## 2019-07-29 ENCOUNTER — Other Ambulatory Visit: Payer: Self-pay | Admitting: Surgery

## 2019-07-29 DIAGNOSIS — R9389 Abnormal findings on diagnostic imaging of other specified body structures: Secondary | ICD-10-CM

## 2019-07-29 DIAGNOSIS — R2232 Localized swelling, mass and lump, left upper limb: Secondary | ICD-10-CM

## 2019-07-29 NOTE — Progress Notes (Signed)
Nutrition  Patient identified by attended Breast Clinic on 07/15/19.  Patient was given nutrition information with RD contact information at that time.   Chart reviewed.   Planning surgery, oncotype/mammaprint, possible radiation and antiestrogens.   No weight loss noted  Patient is currently not at nutritional risk.  Please consult RD if nutritional issues arise.   Weston Fulco B. Zenia Resides, [place], [place] Registered Dietitian [phone] (pager)

## 2019-07-30 ENCOUNTER — Encounter: Payer: Self-pay | Admitting: *Deleted

## 2019-07-30 ENCOUNTER — Other Ambulatory Visit: Payer: Self-pay | Admitting: *Deleted

## 2019-07-30 DIAGNOSIS — C50412 Malignant neoplasm of upper-outer quadrant of left female breast: Secondary | ICD-10-CM

## 2019-07-30 NOTE — Progress Notes (Unsigned)
Pt called and left vm requesting second opinion at [facility]. Referral and MR faxed to new pt coordinator at [phone]

## 2019-07-31 ENCOUNTER — Other Ambulatory Visit: Payer: 59

## 2019-08-04 ENCOUNTER — Telehealth: Payer: Self-pay

## 2019-08-04 NOTE — Telephone Encounter (Signed)
Received a vm from [place] at [facility] requesting demographic information for [facility].  Demographics faxed to [phone] attn Renee.

## 2019-08-05 ENCOUNTER — Other Ambulatory Visit: Payer: 59

## 2019-08-05 ENCOUNTER — Telehealth: Payer: Self-pay | Admitting: *Deleted

## 2019-08-05 ENCOUNTER — Other Ambulatory Visit: Payer: Self-pay | Admitting: Surgery

## 2019-08-05 DIAGNOSIS — Z853 Personal history of malignant neoplasm of breast: Secondary | ICD-10-CM

## 2019-08-05 NOTE — Telephone Encounter (Signed)
Pt to new pt coordinator at [facility] to ensure medical records have been received for 2nd opinion. Was informed they do have the records and they will be reviewed by PA and then the pt will be called with an appt. Called pt to relay this information as well as informed we will try to move her MR bx appt to an earlier date.

## 2019-08-07 ENCOUNTER — Other Ambulatory Visit: Payer: Self-pay | Admitting: Surgery

## 2019-08-07 DIAGNOSIS — Z853 Personal history of malignant neoplasm of breast: Secondary | ICD-10-CM

## 2019-08-10 ENCOUNTER — Other Ambulatory Visit (HOSPITAL_COMMUNITY): Payer: Self-pay | Admitting: Diagnostic Radiology

## 2019-08-10 ENCOUNTER — Ambulatory Visit
Admission: RE | Admit: 2019-08-10 | Discharge: 2019-08-10 | Disposition: A | Discharge status: Home / Self Care | Payer: 59 | Source: Ambulatory Visit | Attending: Surgery | Admitting: Surgery

## 2019-08-10 ENCOUNTER — Other Ambulatory Visit: Payer: Self-pay

## 2019-08-10 DIAGNOSIS — R9389 Abnormal findings on diagnostic imaging of other specified body structures: Secondary | ICD-10-CM

## 2019-08-10 MED ORDER — GADOBUTROL 1 MMOL/ML IV SOLN
5.0000 mL | Freq: Once | INTRAVENOUS | Status: AC | PRN
  Administered 2019-08-10: 5 mL via INTRAVENOUS

## 2019-08-11 ENCOUNTER — Encounter: Payer: Self-pay | Admitting: *Deleted

## 2019-08-12 ENCOUNTER — Encounter: Payer: Self-pay | Admitting: Hematology

## 2019-08-12 ENCOUNTER — Telehealth: Payer: Self-pay | Admitting: *Deleted

## 2019-08-12 ENCOUNTER — Encounter: Payer: Self-pay | Admitting: *Deleted

## 2019-08-12 NOTE — Telephone Encounter (Signed)
Called pt to discuss questions sent via mychart message. Discussed oncotype testing and post mastectomy radiation recommendations based on final size of tumor as well the need to take AI even if has bil mastectomy. Denies further questions or needs at this time.  Pathology results faxed to [facility] from MR bx on 6/21.

## 2019-08-14 ENCOUNTER — Ambulatory Visit (INDEPENDENT_AMBULATORY_CARE_PROVIDER_SITE_OTHER): Payer: 59 | Admitting: Plastic Surgery

## 2019-08-14 ENCOUNTER — Encounter: Payer: Self-pay | Admitting: Plastic Surgery

## 2019-08-14 ENCOUNTER — Other Ambulatory Visit: Payer: Self-pay

## 2019-08-14 VITALS — BP 136/82 | HR 69 | Temp 98.2°F | Ht 63.0 in | Wt 131.4 lb

## 2019-08-14 DIAGNOSIS — C50912 Malignant neoplasm of unspecified site of left female breast: Secondary | ICD-10-CM

## 2019-08-14 NOTE — Progress Notes (Signed)
Referring Provider Coralie Keens, MD [address] [place]  [id number]   CC:  Chief Complaint  Patient presents with  . Advice Only      Jasmine Shelton is an 49 y.o. female.  HPI: Patient presents to discuss a new left-sided breast cancer.  She felt a lump which prompted a mammogram.  This was subsequently biopsied and identified as invasive lobular carcinoma.  The index lesion is about 1.5 cm in diameter and is about 1.5 cm superficial to the pectoralis muscle.  MRI suggested bit more diffuse disease so follow-up MR guided biopsy was performed which was also positive for invasive lobular carcinoma.  Prior to this she had seen Dr. Ninfa Linden and they discussed breast conservation therapy.  She has subsequently seen another breast surgeon with [facility] who also discussed breast conservation therapy.  The total area of suspicion on MRI encompasses at least 5 x 5 cm.  She wants to discuss the full range of options for reconstruction depending on her choice and cancer treatment.  No Known Allergies  No outpatient encounter medications on file as of 08/14/2019.   No facility-administered encounter medications on file as of 08/14/2019.     Past Medical History:  Diagnosis Date  . Breast cancer ([place])   . Family history of colon cancer   . Kidney stones     Past Surgical History:  Procedure Laterality Date  . cold knife conization      Family History  Problem Relation Age of Onset  . Other Father        abdominal GIST dx 59  . Colon cancer Maternal Grandfather 69  . Cancer Maternal Uncle        blood cancer    Social History   Social History Narrative  . Not on file     Review of Systems General: Denies fevers, chills, weight loss CV: Denies chest pain, shortness of breath, palpitations  Physical Exam Vitals with BMI 08/14/2019 07/15/2019 01/10/2019  Height 5\' 3"  5\' 3"  -  Weight 131 lbs 6 oz 130 lbs 11 oz -  BMI 14.97 02.63 -  Systolic 785 885  027  Diastolic 82 84 93  Pulse 69 69 73    General:  No acute distress,  Alert and oriented, Non-Toxic, Normal speech and affect Breast examination shows grade 2 ptosis.  She has C to D cup sized breasts.  She has bruising laterally from the biopsy sites on the left.  She is fairly symmetric.  I cannot identify any obvious scars. Examination of the abdomen shows no obvious scars.  Abdomen is soft nontender.  She has mild soft tissue excess in the infraumbilical area.  Assessment/Plan Patient presents to discuss reconstructive options after treatment of her breast cancer.  I did mention to her that while breast conservation therapy may be an option for her as the lumpectomy specimen gets larger the contour changes in the breast also increased.  Furthermore she would require radiation after the lumpectomy which would exacerbate the asymmetry.  She does want to be symmetric but she also has a son that is getting ready to go to collagen a career and is weighing the pros and cons of various treatment options.  We discussed a broad range of reconstructive options for all potential surgical cancer treatment choices.  If she chooses to have a lumpectomy and radiation some contour improvements could be obtained with fat grafting.  I did explain that this would not overcome  the asymmetries but would make it better.  Furthermore if she chooses to have a lumpectomy in the specimen becomes too large or a positive margin would be found she would always have the option of converting to a mastectomy if indicated.  We discussed nipple sparing and skin sparing mastectomy.  I do think she would be an acceptable candidate for nipple sparing from a reconstructive standpoint.  This would certainly depend on the proximity of the tumor to the nipple but as far as I can tell the index lesion seems more posterior and lateral.  In the setting of mastectomy we discussed implant-based and autologous reconstruction.  I explained  that implant-based would likely include a tissue expander to be placed at the time of mastectomy that would subsequently be switched to a permanent implant.  We discussed a number of methods for autologous reconstruction as she voiced some hesitation in an implant-based reconstruction.  We discussed latissimus flap reconstruction although I do not feel like this would match well with the right side if a latissimus flap only was done.  I do not think the latissimus alone would give her enough projection to match.  We also discussed abdominally based flap reconstruction which I think could be a good option for her but if she wants to pursue that I would refer her for consultation for a DIEP free flap to avoid having to harvest the rectus muscle.  Ultimately she still has a lot of decisions to make and I believe she still needs to meet with Dr. Ninfa Linden again after the results of her most recent biopsy to get his opinion on the cancer operation.  If she decides to go forward with mastectomy I will see her again for more focused discussion on her reconstructive options.  I spent over 45 minutes discussing the various alternatives with the patient and her husband.  Cindra Presume 08/14/2019, 2:34 PM

## 2019-08-20 ENCOUNTER — Telehealth: Payer: Self-pay | Admitting: *Deleted

## 2019-08-20 ENCOUNTER — Encounter: Payer: Self-pay | Admitting: *Deleted

## 2019-08-20 NOTE — Telephone Encounter (Signed)
Received call from patient stating she has decided to have all her care at [facility] and wanted to make sure all her appointments have been cancelled.  Encouraged her to call if she needs anything in the future. Notified team.

## 2019-08-25 ENCOUNTER — Inpatient Hospital Stay (HOSPITAL_COMMUNITY): Admission: RE | Admit: 2019-08-25 | Payer: 59 | Source: Ambulatory Visit

## 2019-08-25 ENCOUNTER — Other Ambulatory Visit (HOSPITAL_COMMUNITY): Payer: 59

## 2019-08-26 ENCOUNTER — Inpatient Hospital Stay: Admission: RE | Admit: 2019-08-26 | Payer: 59 | Source: Ambulatory Visit

## 2019-08-27 ENCOUNTER — Encounter (HOSPITAL_COMMUNITY): Admission: RE | Payer: Self-pay | Source: Home / Self Care

## 2019-08-27 ENCOUNTER — Other Ambulatory Visit (HOSPITAL_COMMUNITY): Payer: 59

## 2019-08-27 ENCOUNTER — Ambulatory Visit (HOSPITAL_COMMUNITY): Admission: RE | Admit: 2019-08-27 | Payer: 59 | Source: Home / Self Care | Admitting: Surgery

## 2019-08-27 SURGERY — BREAST LUMPECTOMY WITH RADIOACTIVE SEED AND SENTINEL LYMPH NODE BIOPSY
Anesthesia: General | Site: Breast | Laterality: Left

## 2022-04-09 IMAGING — MG MM DIGITAL DIAGNOSTIC UNILAT*L* W/ TOMO W/ CAD
6 series · 6 of 18 positions shown · non-contrast
Comparison: Previous exam(s).

CLINICAL DATA: 48-year-old female presenting for evaluation of a
palpable lump in the upper outer left breast about 3 weeks.

EXAM:
DIGITAL DIAGNOSTIC UNILATERAL LEFT MAMMOGRAM WITH CAD AND TOMO
LEFT BREAST ULTRASOUND

[L TAN synth-2D]
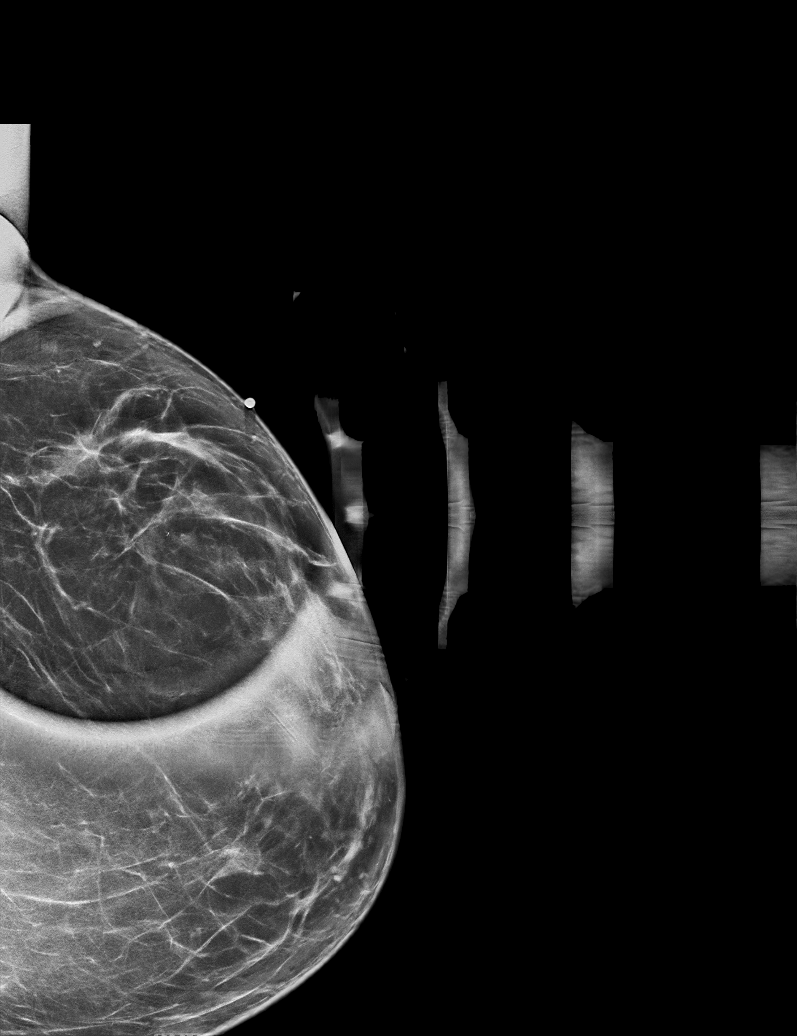

[L CC synth-2D]
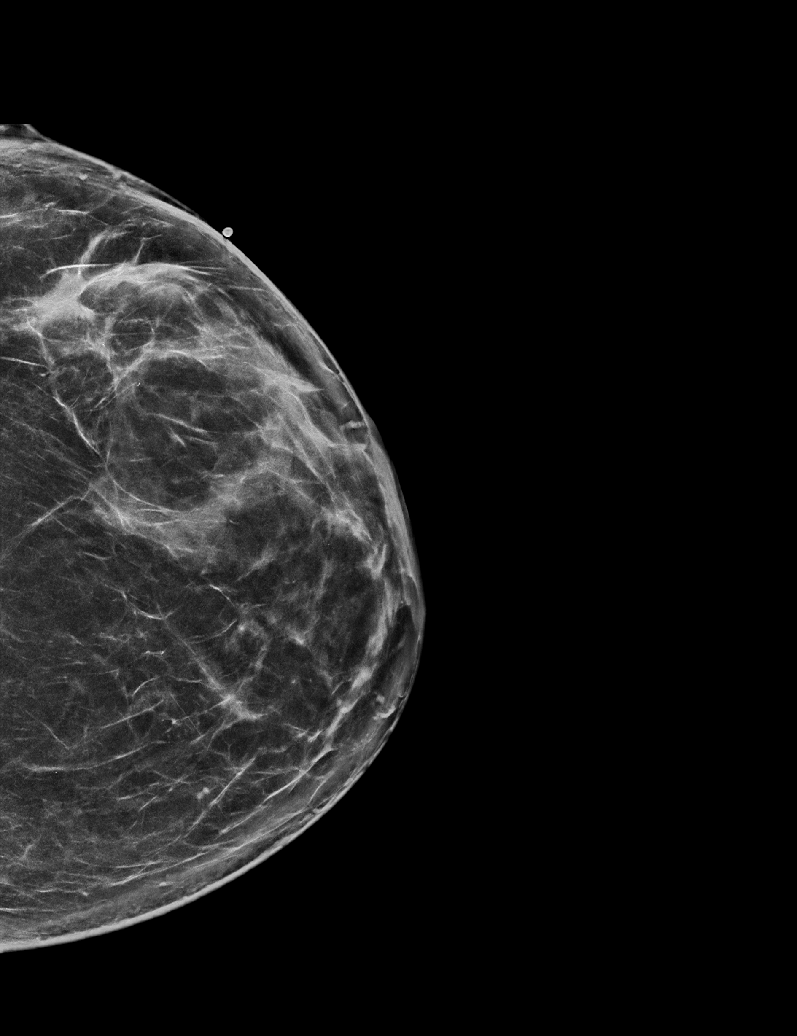

[L MLO synth-2D]
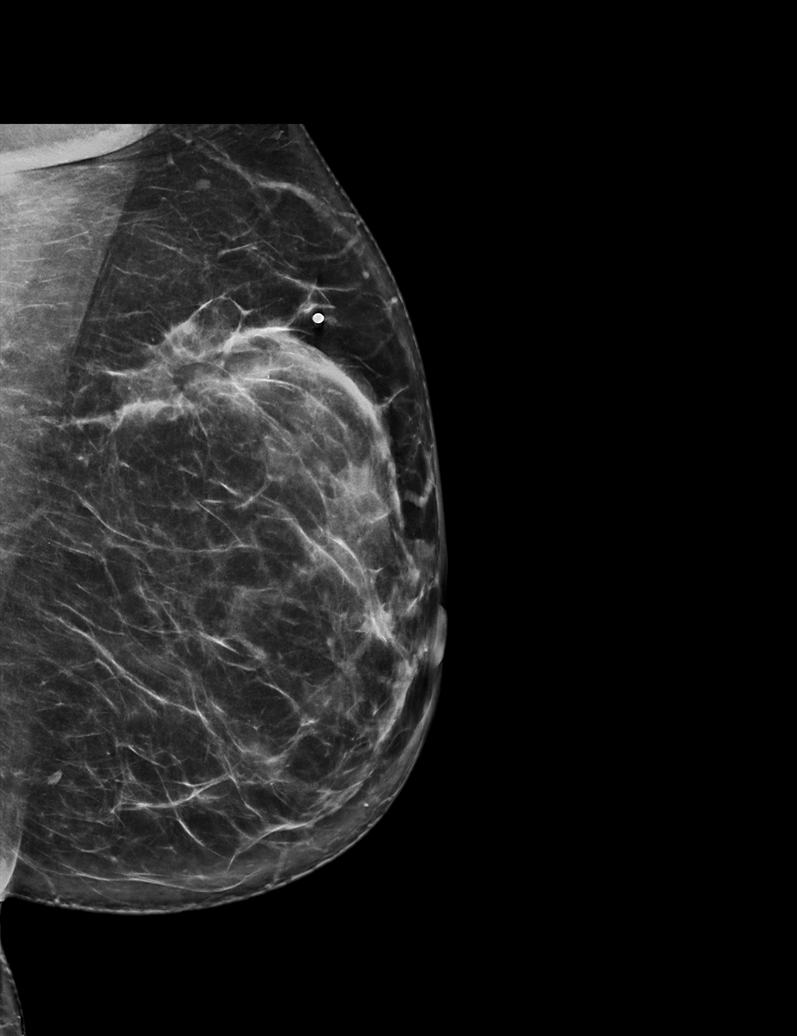

[L CC tomo · tomo slice 39/76.0]
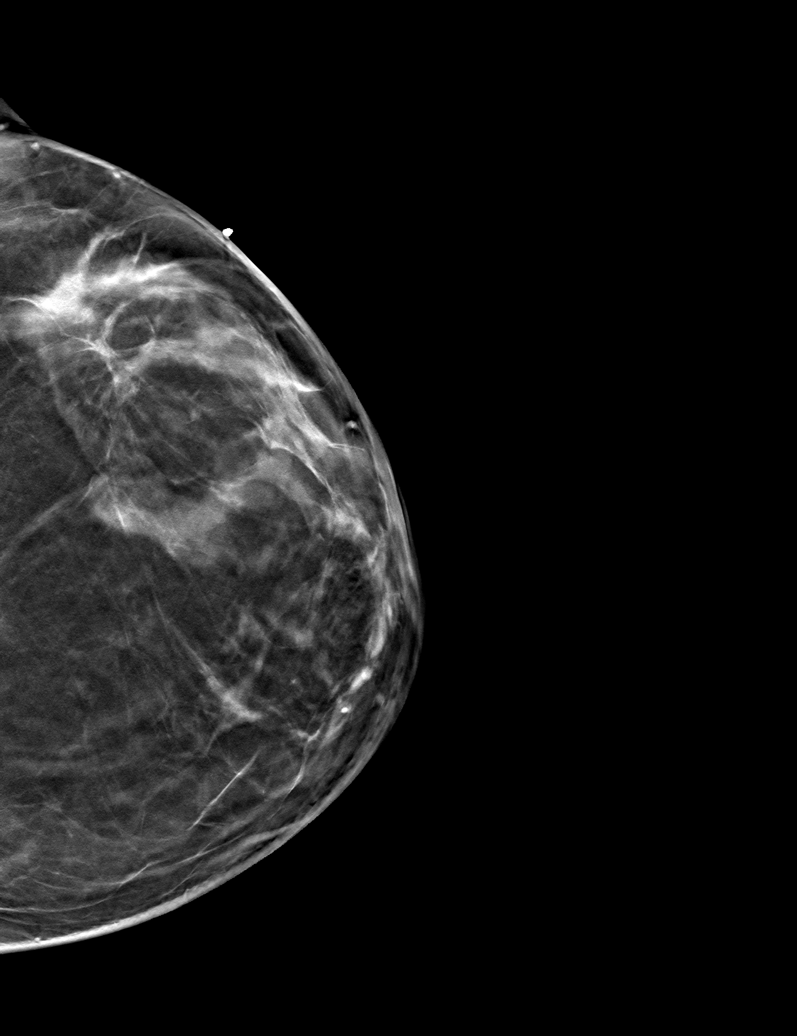

[L MLO tomo · tomo slice 37/73.0]
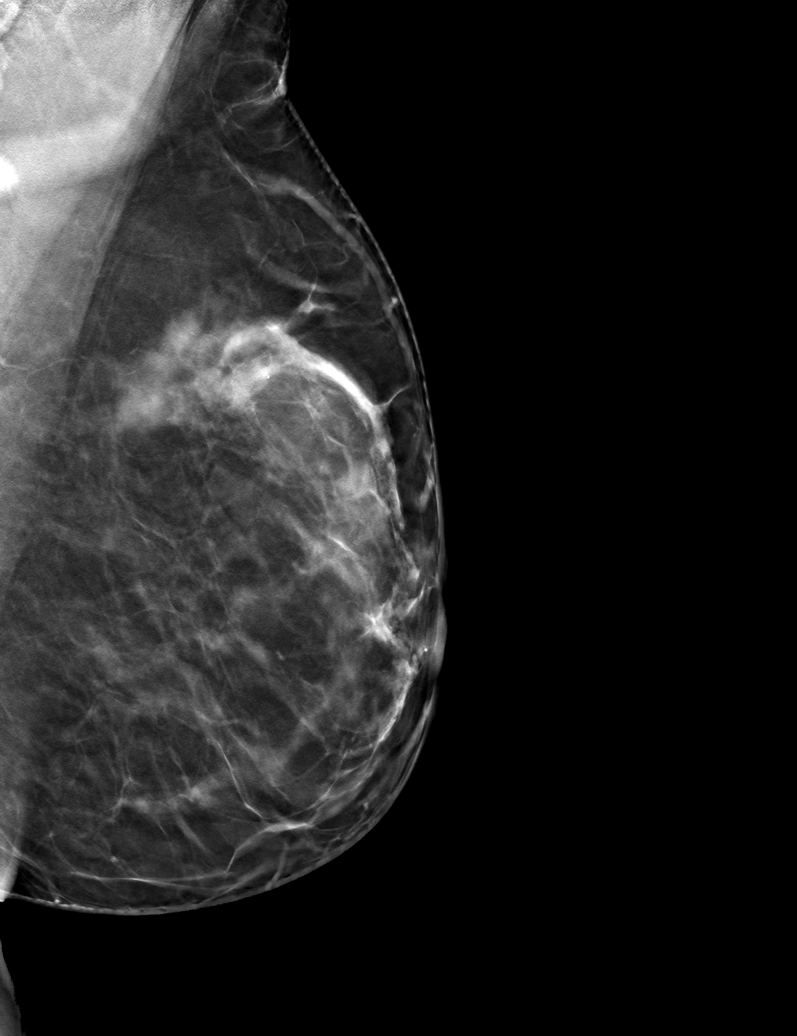

[L TAN tomo · tomo slice 36/71.0]
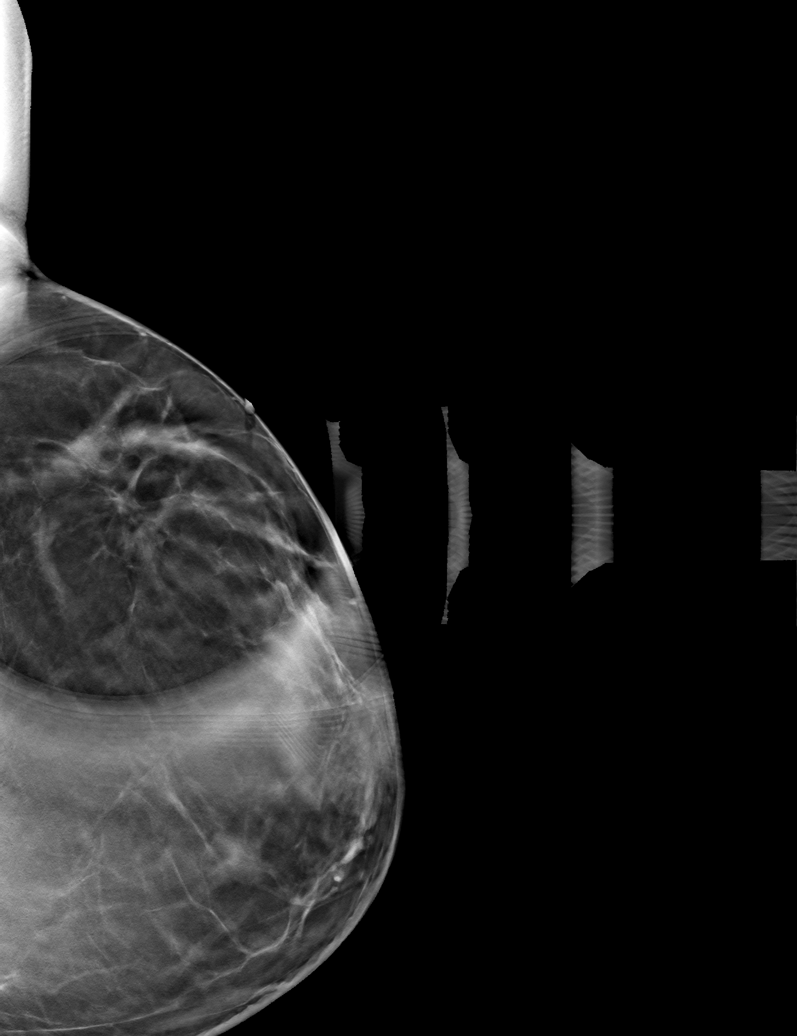

[6 of 18 positions shown; findings below may reference images not displayed]

ACR Breast Density Category c: The breast tissue is heterogeneously
dense, which may obscure small masses.
FINDINGS: A BB has been placed at the palpable site of concern in the
upper-outer quadrant of the left breast. Deep to the palpable marker
there is a broad area of distortion.

Mammographic images were processed with CAD.

Physical exam of the lateral left breast demonstrates a firm
palpable lump spanning approximately 5 cm.

Ultrasound targeted to the left breast at 2 o'clock 5 cm nipple
demonstrates an ill-defined area of shadowing measuring 2.5 x 1.9 x
0.8 cm. Ultrasound of the left axilla demonstrates normal-appearing
lymph nodes.
IMPRESSION: 1. There is a 2.5 cm suspicious mass in the left breast at 2
o'clock, likely corresponding with the area of distortion seen
mammographically.

2.  No evidence of left axillary lymphadenopathy.

RECOMMENDATION:
Ultrasound guided biopsy is recommended for the left breast mass.
This has been scheduled for 07/07/2019 at [DATE] a.m.

I have discussed the findings and recommendations with the patient.
If applicable, a reminder letter will be sent to the patient
regarding the next appointment.

BI-RADS CATEGORY  5: Highly suggestive of malignancy.

## 2022-04-09 IMAGING — MG MM BREAST LOCALIZATION CLIP
4 series · 4 of 12 positions shown · non-contrast
Comparison: Previous exam(s).

CLINICAL DATA: Patient status post ultrasound-guided biopsy left
breast mass 2 o'clock position.

EXAM:
DIAGNOSTIC LEFT MAMMOGRAM POST ULTRASOUND BIOPSY

[L ML synth-2D]
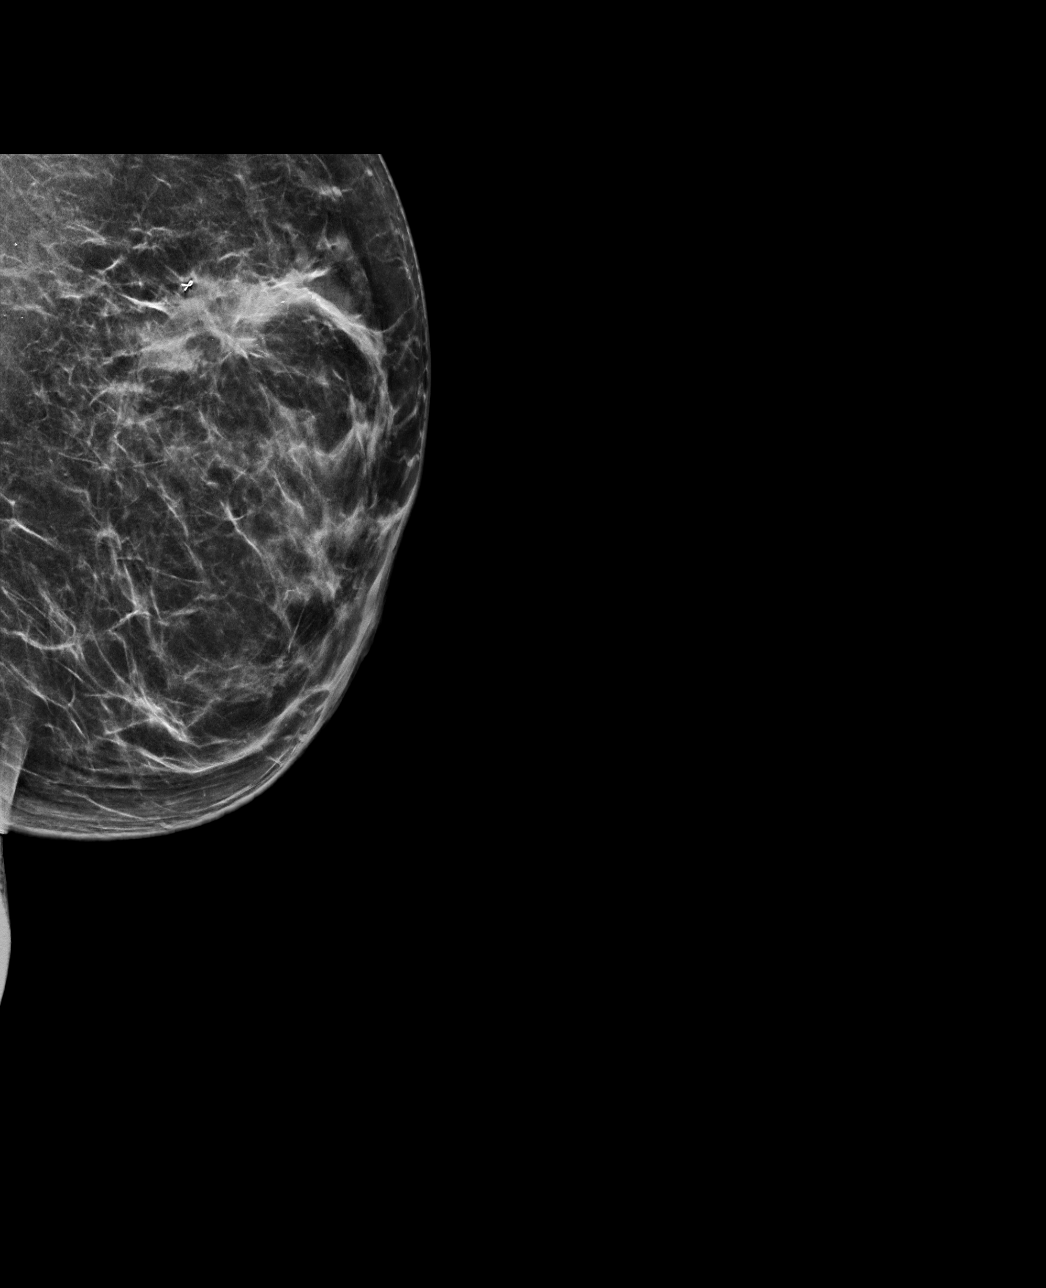

[L CC synth-2D]
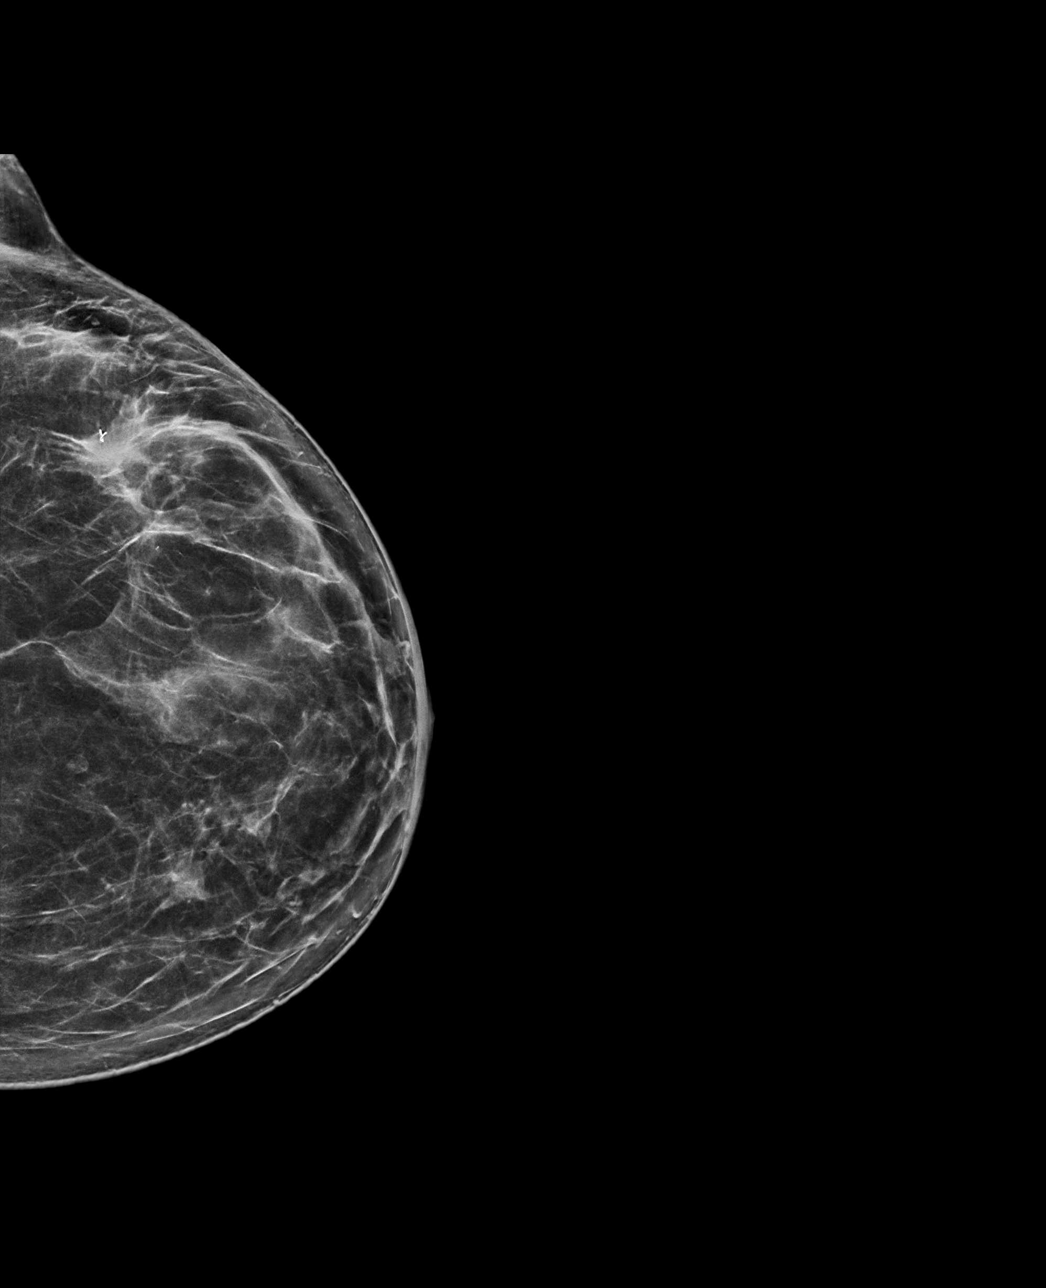

[L ML tomo · tomo slice 35/69.0]
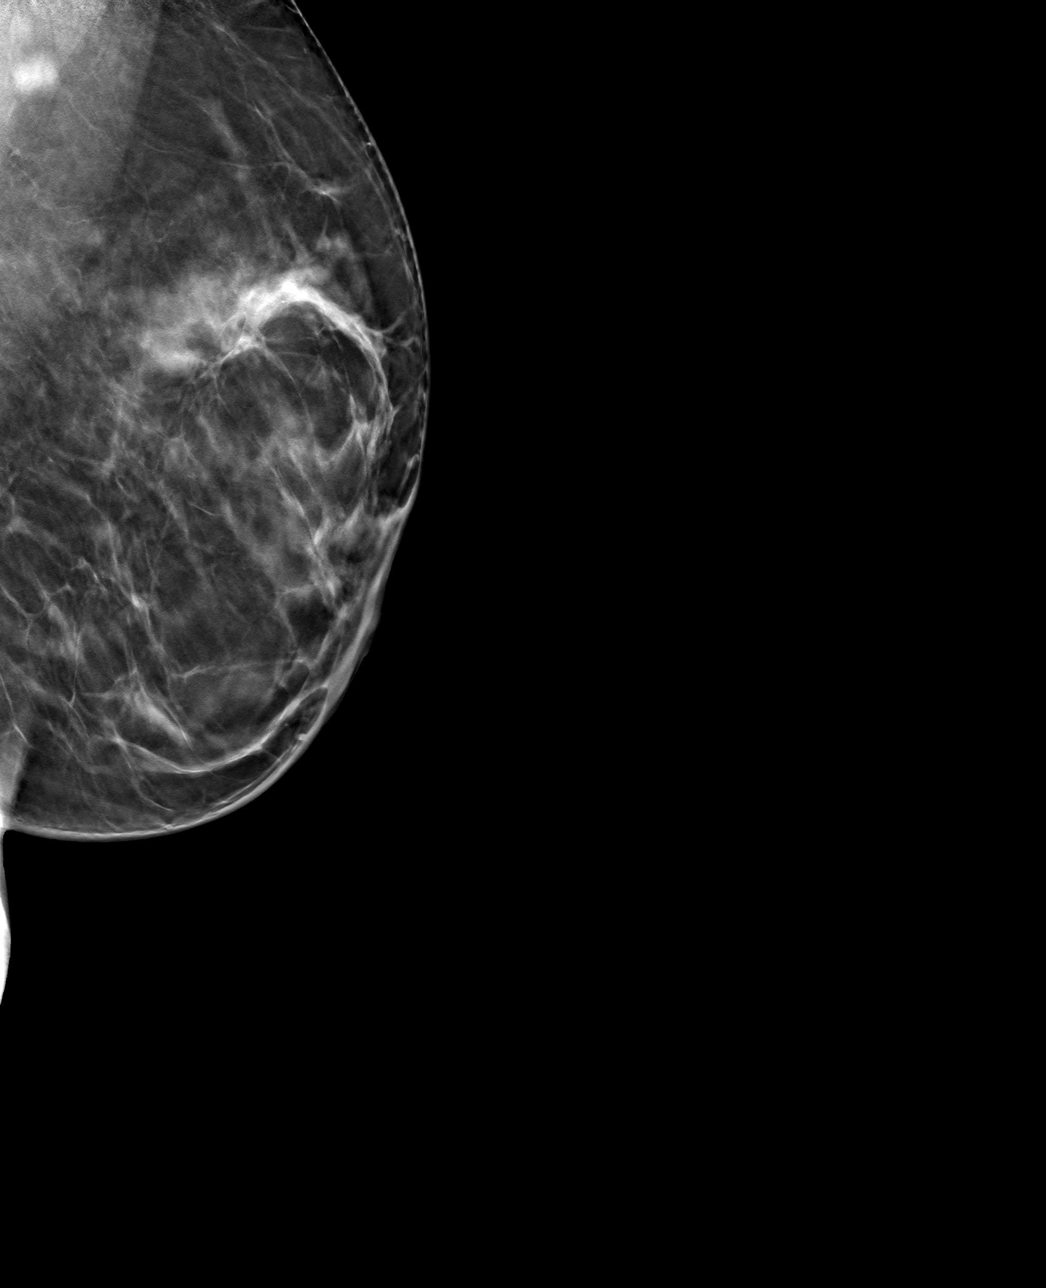

[L CC tomo · tomo slice 38/75.0]
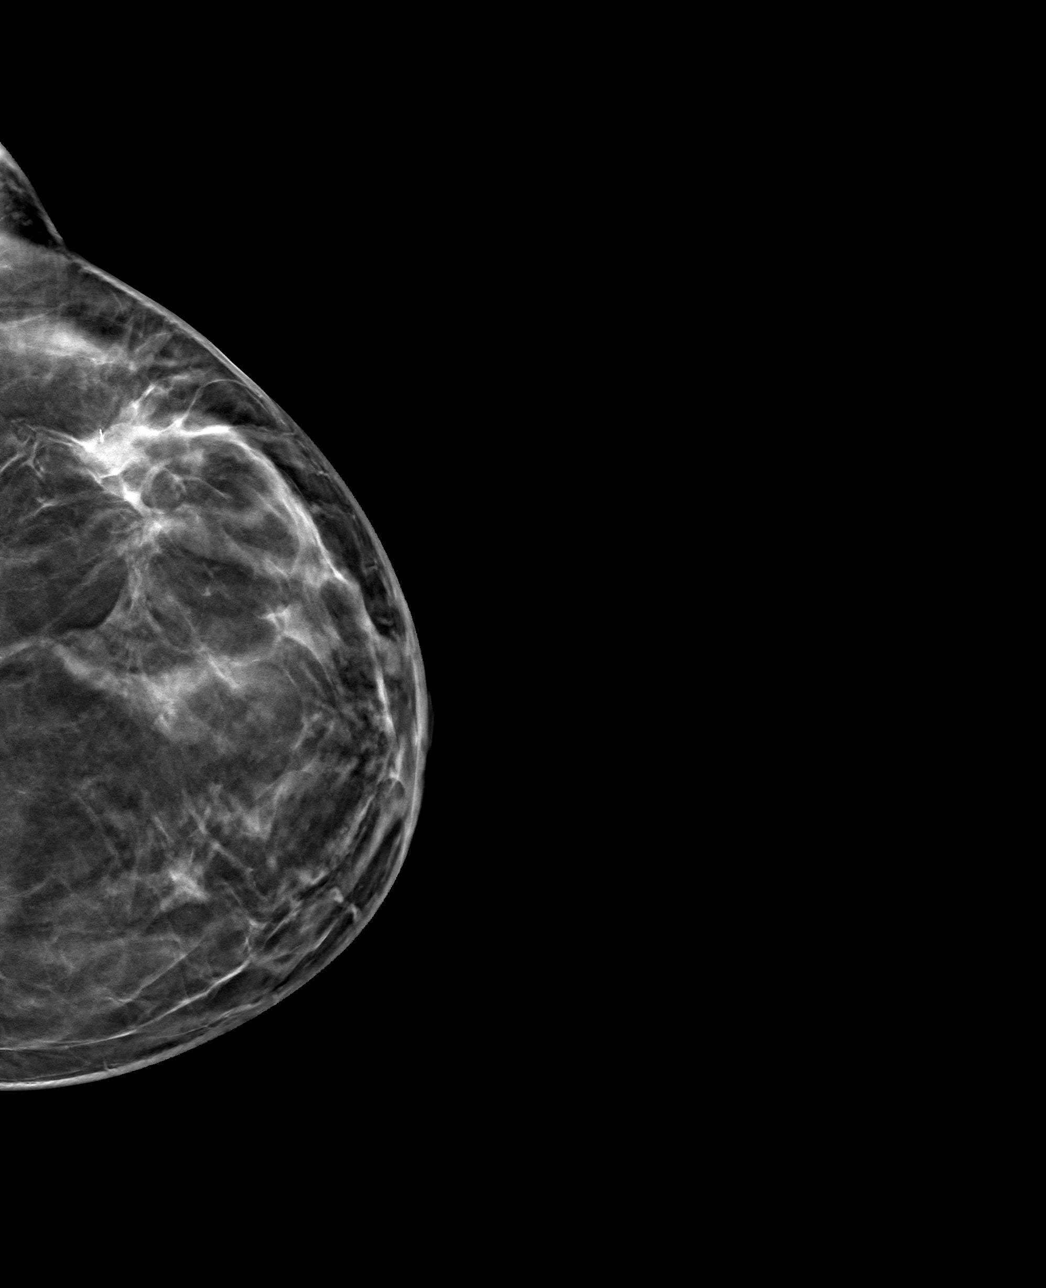

[4 of 12 positions shown; findings below may reference images not displayed]

FINDINGS: Mammographic images were obtained following ultrasound guided biopsy
of left breast mass 2 o'clock position. The biopsy marking clip is
in expected position at the site of biopsy.
IMPRESSION: Appropriate positioning of the ribbon shaped biopsy marking clip at
the site of biopsy in the left breast mass 2 o'clock position.

Final Assessment: Post Procedure Mammograms for Marker Placement

## 2022-04-09 IMAGING — US US BREAST BX W LOC DEV 1ST LESION IMG BX SPEC US GUIDE*L*
1 series · 6 of 6 positions shown · non-contrast
Comparison: Previous exam(s).
COMPARISON: Previous exam(s).

Addendum:
CLINICAL DATA: Patient with indeterminate left breast mass 2
o'clock position.

EXAM:
ULTRASOUND GUIDED LEFT BREAST CORE NEEDLE BIOPSY

[Series 1: us breast bx w loc dev 1st lesion img bx spec us g · 0.07mm/px · 6 of 6 slices shown]
[im 1/6]
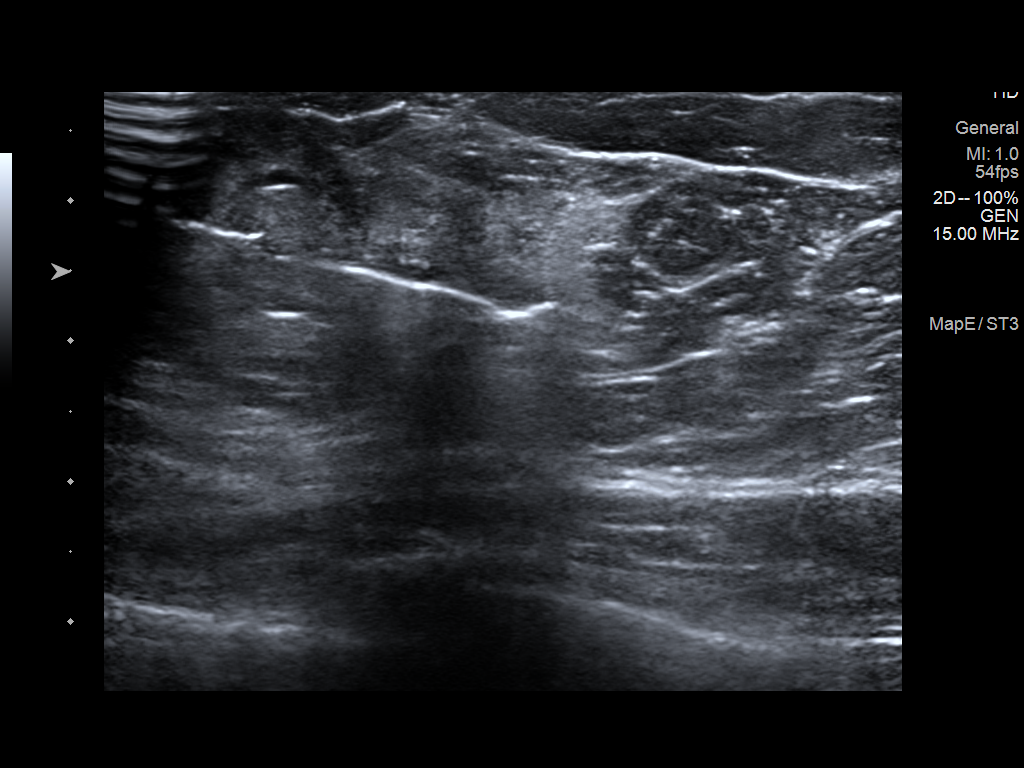
[im 2/6]
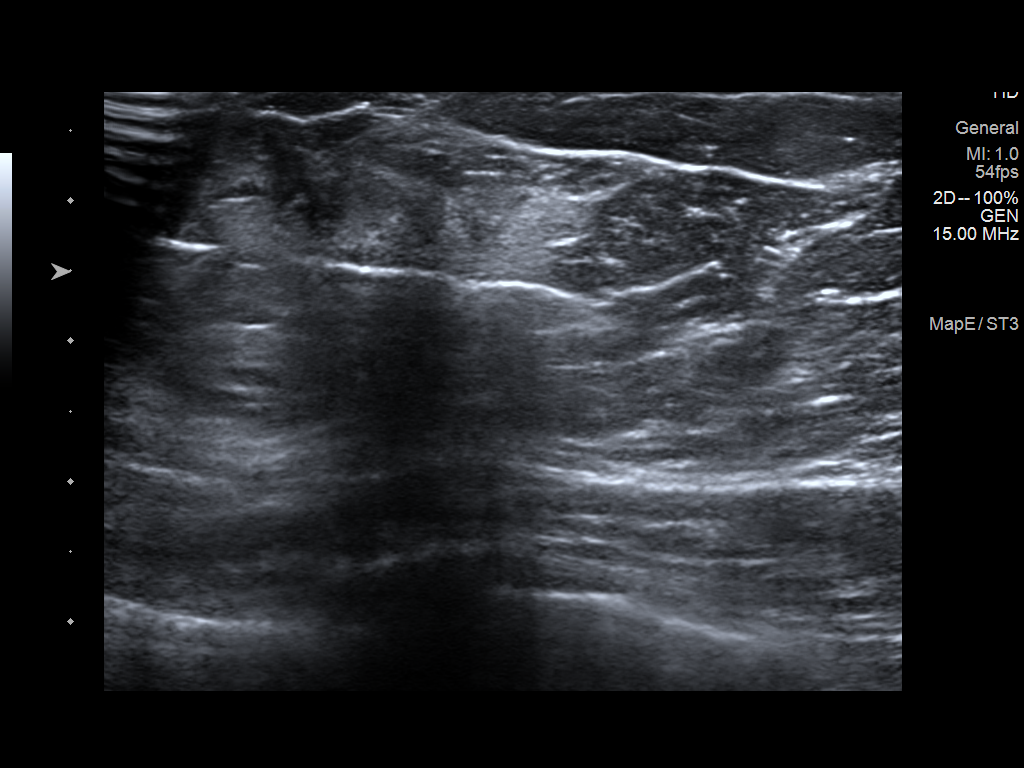
[im 3/6]
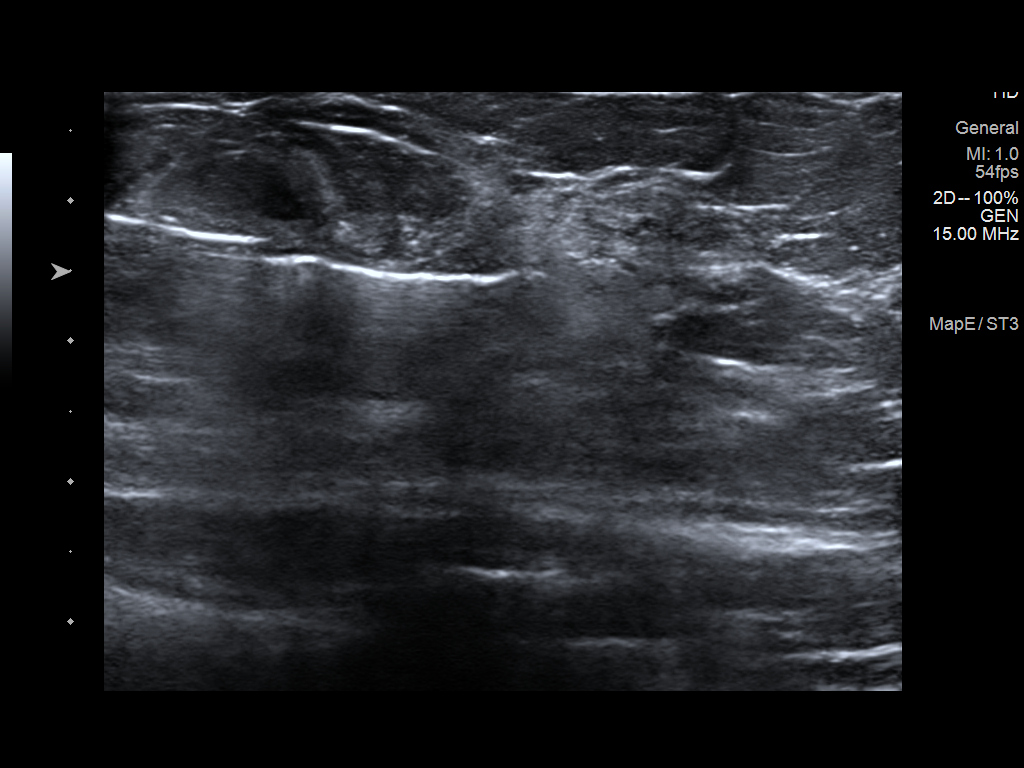
[im 4/6]
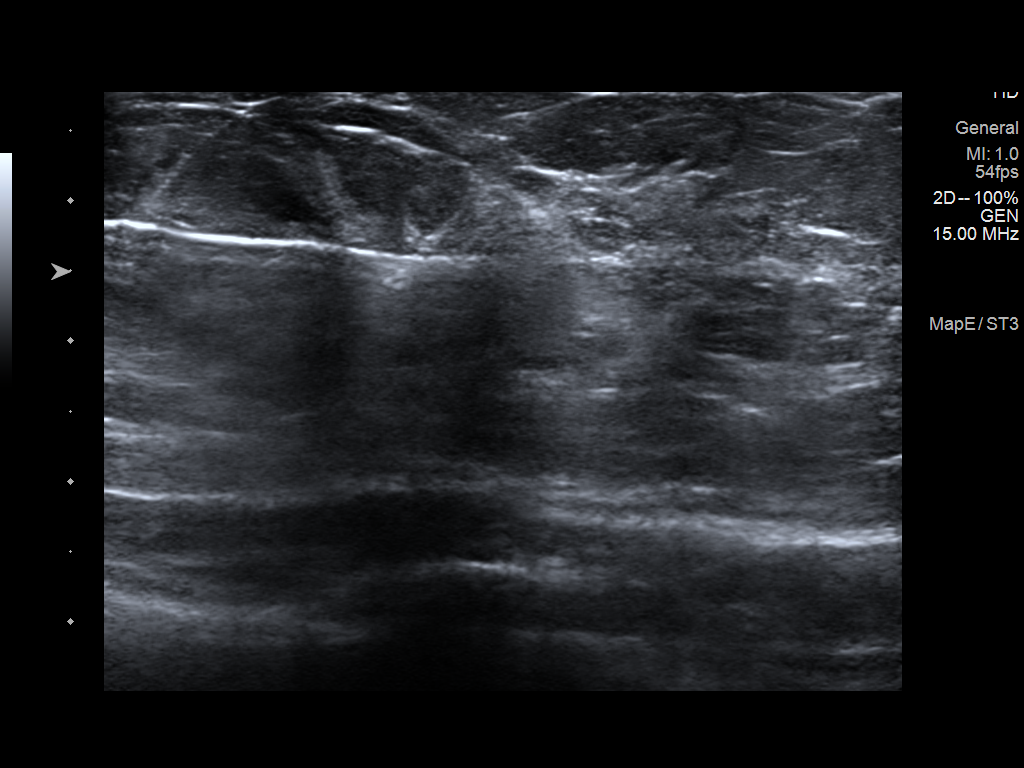
[im 5/6]
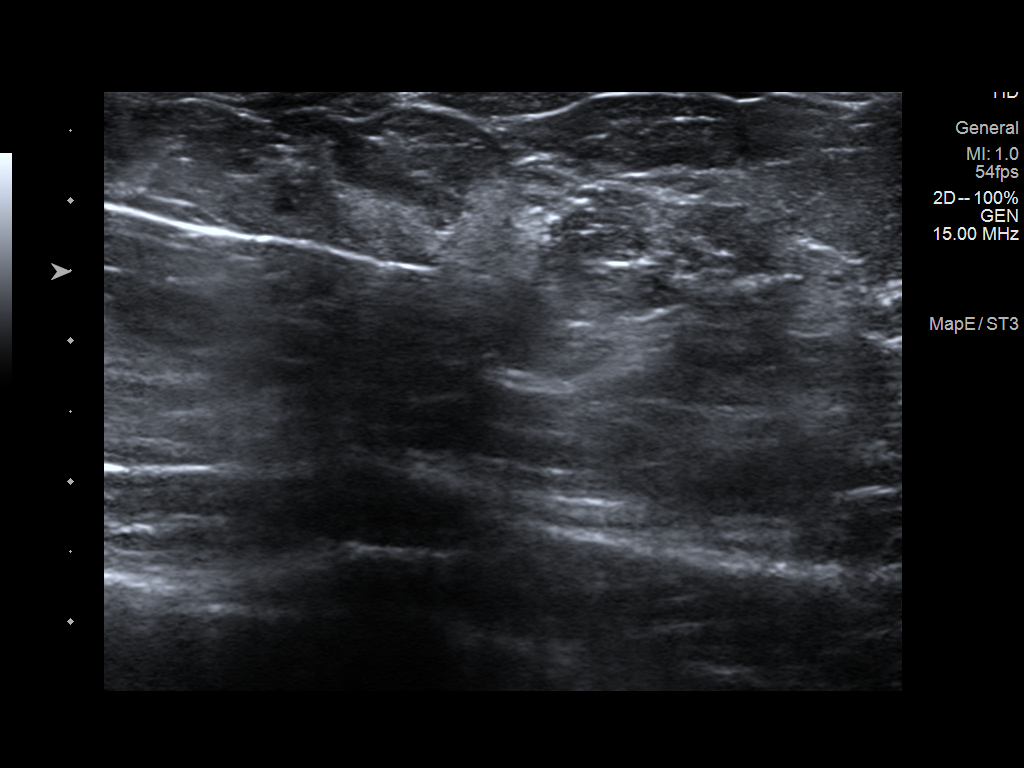
[im 6/6]
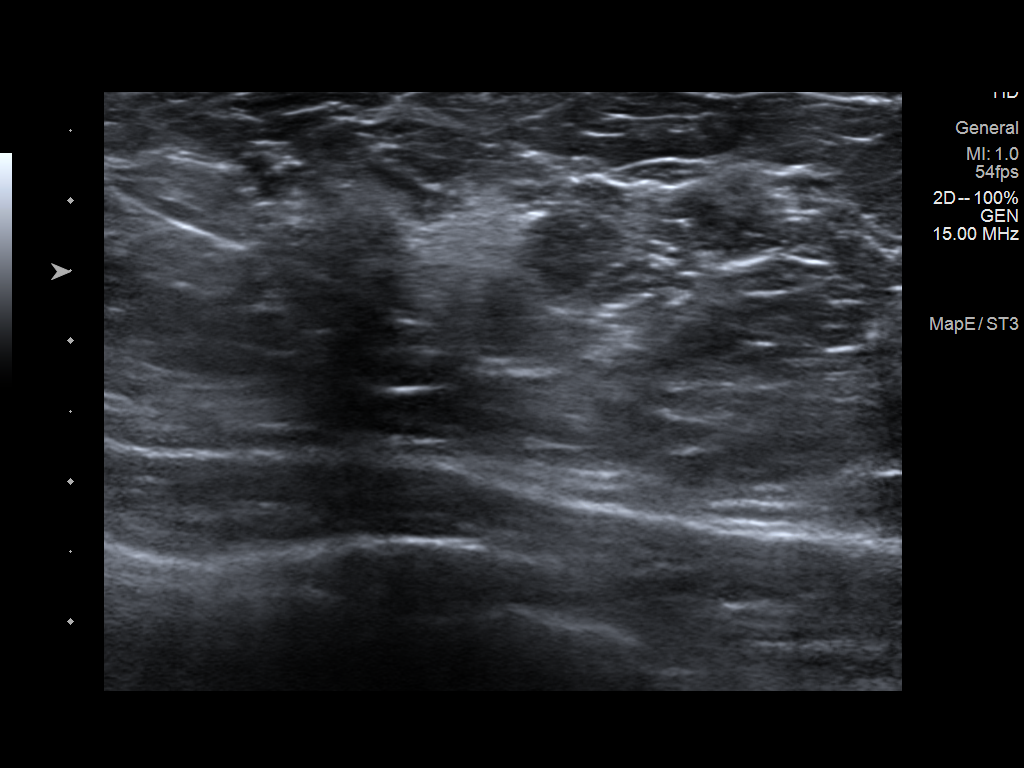

[6 of 6 positions shown; findings below may reference images not displayed]



Lesion quadrant: Upper outer quadrant

Using sterile technique and 1% Lidocaine as local anesthetic, under
direct ultrasound visualization, a 14 gauge Tiger device was
used to perform biopsy of left breast mass 2 o'clock position using
a lateral approach. At the conclusion of the procedure ribbon shaped
tissue marker clip was deployed into the biopsy cavity. Follow up 2
view mammogram was performed and dictated separately.
IMPRESSION: Ultrasound guided biopsy of left breast mass 2 o'clock position. No
apparent complications.

ADDENDUM:
Pathology revealed GRADE I-II INVASIVE MAMMARY CARCINOMA of the Left
breast, 2 o'clock. This was found to be concordant by Dr. Franklin B
Carmella.

Pathology results were discussed with the patient by telephone. The
patient reported doing well after the biopsy with tenderness at the
site. Post biopsy instructions and care were reviewed and questions
were answered. The patient was encouraged to call The [REDACTED]

The patient was referred to [REDACTED]
[REDACTED] at [REDACTED] on

Pathology results reported by Sebopego Naledi, RN on 07/08/2019.



Lesion quadrant: Upper outer quadrant

Using sterile technique and 1% Lidocaine as local anesthetic, under
direct ultrasound visualization, a 14 gauge Tiger device was
used to perform biopsy of left breast mass 2 o'clock position using
a lateral approach. At the conclusion of the procedure ribbon shaped
tissue marker clip was deployed into the biopsy cavity. Follow up 2
view mammogram was performed and dictated separately.
IMPRESSION: Ultrasound guided biopsy of left breast mass 2 o'clock position. No
apparent complications.
